# Patient Record
Sex: Female | Born: 1995 | Hispanic: Refuse to answer | Marital: Single | Smoking: Former smoker
Health system: Southern US, Community
[De-identification: ages and names within clinical notes are randomized; demographics above are authoritative.]

## PROBLEM LIST (undated history)

## (undated) ENCOUNTER — Inpatient Hospital Stay (HOSPITAL_COMMUNITY): Payer: Self-pay

## (undated) DIAGNOSIS — J45909 Unspecified asthma, uncomplicated: Secondary | ICD-10-CM

## (undated) DIAGNOSIS — Z87891 Personal history of nicotine dependence: Secondary | ICD-10-CM

## (undated) DIAGNOSIS — F1291 Cannabis use, unspecified, in remission: Secondary | ICD-10-CM

## (undated) DIAGNOSIS — Z87898 Personal history of other specified conditions: Secondary | ICD-10-CM

## (undated) HISTORY — PX: NO PAST SURGERIES: SHX2092

---
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
## 2016-01-07 ENCOUNTER — Encounter (HOSPITAL_COMMUNITY)
Admission: RE | Admit: 2016-01-07 | Discharge: 2016-01-07 | Disposition: A | Discharge status: Home / Self Care | Payer: Medicaid Other | Source: Ambulatory Visit | Attending: Obstetrics & Gynecology | Admitting: Obstetrics & Gynecology

## 2016-02-06 ENCOUNTER — Encounter (HOSPITAL_COMMUNITY)
Admission: RE | Admit: 2016-02-06 | Discharge: 2016-02-06 | Disposition: A | Discharge status: Home / Self Care | Payer: Self-pay | Source: Ambulatory Visit | Attending: Obstetrics & Gynecology | Admitting: Obstetrics & Gynecology

## 2016-03-07 ENCOUNTER — Encounter (HOSPITAL_COMMUNITY)
Admission: RE | Admit: 2016-03-07 | Discharge: 2016-03-07 | Disposition: A | Discharge status: Home / Self Care | Payer: Self-pay | Source: Ambulatory Visit | Attending: Obstetrics & Gynecology | Admitting: Obstetrics & Gynecology

## 2016-04-04 NOTE — Progress Notes (Signed)
Subjective:     Patient ID: Helen Strickland, female   DOB: 04/01/1996, 20 y.o.   MRN: [id number]  HPI Helen Strickland is a 20yo female presenting today for general exam to follow up vaginal discharge. - Denies any new changes in diagnoses, medications, family history, or social history. - Reports vaginal discharge. White with foul odor.  - Denies genital ulcers or vaginal pain. Denies fever. Denies abdominal pain or dysuria.  - Initially requested pap smear--thought this was for STD and vaginal discharge screening instead of cervical cancer screening. Not yet due for pap smear. - History of vaginal candidiasis, trichomonas, and bacterial vaginosis in 05/2015 - HIV and RPR negative in 05/2015 - Also requesting Nexplanon removal. States it has causes worsening headaches and irregular unpredictable periods. Placed in 02/2015 into left arm. - Does not know which form of birth control she would like to transition to. Has only had Nexplanon in the past. Would like more information on different methods. Reports history of migraines. Denies history or family history of clots. Nonsmoker.   Review of Systems Per HPI.     Objective:   Physical Exam  Constitutional: She appears well-developed and well-nourished. No distress.  HENT:  Head: Normocephalic and atraumatic.  Cardiovascular: Normal rate and regular rhythm.   No murmur heard. Pulmonary/Chest: Effort normal. No respiratory distress. She has no wheezes.  Abdominal: Soft. She exhibits no distension. There is no tenderness.  Genitourinary:  Genitourinary Comments: No vaginal discharge noted. No cervical motion tenderness noted. No adnexal tenderness noted. No vaginal wall tenderness noted.  Skin:  Nexplanon palpable in left upper arm  Psychiatric: She has a normal mood and affect. Her behavior is normal.      Assessment and Plan:     1. Screening for STD (sexually transmitted disease) - Will obtain Wet Prep and screen for Gonorrhea/Chlamydia -  Treatment pending above labs  2. Encounter for other general counseling or advice on contraception - Discussed various methods of birth control and their effectiveness. Handout given. - Return for procedure visit to remove nexplanon

## 2016-04-05 ENCOUNTER — Encounter: Payer: Self-pay | Admitting: Family Medicine

## 2016-04-05 ENCOUNTER — Other Ambulatory Visit (HOSPITAL_COMMUNITY)
Admission: RE | Admit: 2016-04-05 | Discharge: 2016-04-05 | Disposition: A | Discharge status: Home / Self Care | Payer: Medicaid Other | Source: Ambulatory Visit | Attending: Family Medicine | Admitting: Family Medicine

## 2016-04-05 ENCOUNTER — Ambulatory Visit (INDEPENDENT_AMBULATORY_CARE_PROVIDER_SITE_OTHER): Payer: Medicaid Other | Admitting: Family Medicine

## 2016-04-05 VITALS — BP 105/58 | HR 82 | Temp 98.2°F | Wt 123.4 lb

## 2016-04-05 DIAGNOSIS — Z0000 Encounter for general adult medical examination without abnormal findings: Secondary | ICD-10-CM

## 2016-04-05 DIAGNOSIS — Z113 Encounter for screening for infections with a predominantly sexual mode of transmission: Secondary | ICD-10-CM | POA: Diagnosis not present

## 2016-04-05 DIAGNOSIS — Z3009 Encounter for other general counseling and advice on contraception: Secondary | ICD-10-CM | POA: Diagnosis not present

## 2016-04-05 LAB — POCT WET PREP (WET MOUNT)
CLUE CELLS WET PREP WHIFF POC: NEGATIVE
Trichomonas Wet Prep HPF POC: ABSENT

## 2016-04-05 NOTE — Patient Instructions (Signed)
Thank you so much for coming to visit today! Please return to have your Nexplanon removed. Look over the handout on birth control methods and let [place] know if you have any questions. If you prefer an IUD, we will need to schedule you in our Gyn clinic for placement. We obtained several tests today and will let you know the results and if any treatment is needed.  Dr. Caroleen Hammanumley

## 2016-04-06 ENCOUNTER — Telehealth: Payer: Self-pay | Admitting: Family Medicine

## 2016-04-06 ENCOUNTER — Encounter (HOSPITAL_COMMUNITY)
Admission: RE | Admit: 2016-04-06 | Discharge: 2016-04-06 | Disposition: A | Discharge status: Home / Self Care | Payer: Self-pay | Source: Ambulatory Visit | Attending: Obstetrics & Gynecology | Admitting: Obstetrics & Gynecology

## 2016-04-06 DIAGNOSIS — Z309 Encounter for contraceptive management, unspecified: Secondary | ICD-10-CM | POA: Insufficient documentation

## 2016-04-06 MED ORDER — METRONIDAZOLE 500 MG PO TABS: 500.0000 mg | ORAL_TABLET | Freq: Two times a day (BID) | ORAL | 0 refills | Status: DC

## 2016-04-06 NOTE — Telephone Encounter (Signed)
Called Helen Strickland concerning wet prep results. Metronidazole sent to pharmacy for Bacterial Vaginosis.

## 2016-04-07 LAB — CERVICOVAGINAL ANCILLARY ONLY
Chlamydia: POSITIVE — AB
Neisseria Gonorrhea: NEGATIVE

## 2016-04-11 ENCOUNTER — Telehealth: Payer: Self-pay | Admitting: Family Medicine

## 2016-04-11 MED ORDER — DOXYCYCLINE HYCLATE 100 MG PO TABS: 100.0000 mg | ORAL_TABLET | Freq: Two times a day (BID) | ORAL | 0 refills | Status: DC

## 2016-04-11 NOTE — Telephone Encounter (Signed)
Attempted to contact concerning positive Chlamydia results. Prescription for Doxycycline sent to pharmacy. Will continue to attempt contact.

## 2016-04-12 MED ORDER — METRONIDAZOLE 500 MG PO TABS
500.0000 mg | ORAL_TABLET | Freq: Two times a day (BID) | ORAL | 0 refills | Status: DC
Start: 2016-04-12 — End: 2016-09-20

## 2016-04-12 NOTE — Telephone Encounter (Signed)
Attempted to contact to give results. Mother answered and stated [facility] was not available. Mother reported she had trouble picking up a prescription from the pharmacy. Pharmacy contacted and confirmed they did not receive the order. Metronidazole reordered and confirmed they had Doxycycline available. Will continue to attempt to contact Sanae to give her the results.  Dr. Caroleen Hammanumley

## 2016-04-17 ENCOUNTER — Ambulatory Visit: Payer: Medicaid Other | Admitting: Family Medicine

## 2016-04-17 NOTE — Telephone Encounter (Signed)
Called pt pharmacy to verify pt picked up meds.

## 2016-04-21 ENCOUNTER — Ambulatory Visit: Payer: Self-pay | Admitting: Family Medicine

## 2016-05-07 ENCOUNTER — Encounter (HOSPITAL_COMMUNITY)
Admission: RE | Admit: 2016-05-07 | Discharge: 2016-05-07 | Disposition: A | Discharge status: Home / Self Care | Payer: Self-pay | Source: Ambulatory Visit | Attending: Obstetrics & Gynecology | Admitting: Obstetrics & Gynecology

## 2016-06-06 ENCOUNTER — Ambulatory Visit (HOSPITAL_COMMUNITY)
Admission: RE | Admit: 2016-06-06 | Discharge: 2016-06-06 | Disposition: A | Discharge status: Home / Self Care | Payer: Self-pay | Source: Ambulatory Visit | Attending: Obstetrics & Gynecology | Admitting: Obstetrics & Gynecology

## 2016-07-06 ENCOUNTER — Ambulatory Visit (HOSPITAL_COMMUNITY)
Admission: RE | Admit: 2016-07-06 | Discharge: 2016-07-06 | Disposition: A | Discharge status: Home / Self Care | Payer: Self-pay | Source: Ambulatory Visit | Attending: Obstetrics & Gynecology | Admitting: Obstetrics & Gynecology

## 2016-08-07 ENCOUNTER — Ambulatory Visit (HOSPITAL_COMMUNITY)
Admission: RE | Admit: 2016-08-07 | Discharge: 2016-08-07 | Disposition: A | Discharge status: Home / Self Care | Payer: Self-pay | Source: Ambulatory Visit | Attending: Obstetrics & Gynecology | Admitting: Obstetrics & Gynecology

## 2016-09-19 ENCOUNTER — Ambulatory Visit (INDEPENDENT_AMBULATORY_CARE_PROVIDER_SITE_OTHER): Payer: Medicaid Other | Admitting: Internal Medicine

## 2016-09-19 ENCOUNTER — Other Ambulatory Visit (HOSPITAL_COMMUNITY)
Admission: RE | Admit: 2016-09-19 | Discharge: 2016-09-19 | Disposition: A | Discharge status: Home / Self Care | Payer: Medicaid Other | Source: Ambulatory Visit | Attending: Family Medicine | Admitting: Family Medicine

## 2016-09-19 ENCOUNTER — Encounter: Payer: Self-pay | Admitting: Internal Medicine

## 2016-09-19 VITALS — BP 116/52 | HR 94 | Temp 98.2°F | Ht 64.0 in | Wt 131.0 lb

## 2016-09-19 DIAGNOSIS — R1030 Lower abdominal pain, unspecified: Secondary | ICD-10-CM

## 2016-09-19 DIAGNOSIS — R109 Unspecified abdominal pain: Secondary | ICD-10-CM | POA: Diagnosis not present

## 2016-09-19 DIAGNOSIS — Z113 Encounter for screening for infections with a predominantly sexual mode of transmission: Secondary | ICD-10-CM | POA: Diagnosis present

## 2016-09-19 LAB — POCT WET PREP (WET MOUNT)
Clue Cells Wet Prep Whiff POC: NEGATIVE
TRICHOMONAS WET PREP HPF POC: ABSENT

## 2016-09-19 LAB — POCT UA - MICROSCOPIC ONLY

## 2016-09-19 LAB — POCT URINALYSIS DIPSTICK
Bilirubin, UA: NEGATIVE
Glucose, UA: NEGATIVE
KETONES UA: NEGATIVE
LEUKOCYTES UA: NEGATIVE
NITRITE UA: NEGATIVE
PH UA: 7
PROTEIN UA: NEGATIVE
Spec Grav, UA: 1.015
Urobilinogen, UA: 0.2

## 2016-09-19 LAB — POCT URINE PREGNANCY: PREG TEST UR: NEGATIVE

## 2016-09-19 NOTE — Progress Notes (Signed)
[facility] Progress Note  Subjective:  [name] is a 21 y.o. female who presents for SDA with concern for abdominal pain.  #Abdominal pain: - Ongoing since placement of nexplanon August 2016 but more severe than before with most recent menstrual cycle, which started 3/9.  - Pain has been so severe she had to stay in bed and had difficulty standing up. Today is the first day she has started to feel okay. Was not able to go to work since the 9th. - Has had cramping of lower abdomen and back prior to and during menstrual cycle since nexplanon placement. Has not tried much for this as does not like taking medicine. Took 600 mg ibuprofen once with no relief.  - Has had associated nausea, headaches, and some diarrhea - Has not had sex since the very beginning of March (prior to period) - First had irregular periods with nexplanon, then short cycles, now back to irregular bleeding (didn't have period since November until now) - Would like nexplanon removed - History of BV but no change in vaginal discharge recently ROS: No vomiting, no dyspareunia, no fever  Objective: Blood pressure (!) 116/52, pulse 94, temperature 98.2 F (36.8 C), temperature source Oral, height 5\' 4"  (1.626 m), weight 131 lb (59.4 kg), SpO2 99 %, unknown if currently breastfeeding. Constitutional: Well-appearing female, in NAD Cardiovascular: RRR, S1, S2, no m/r/g.  Pulmonary/Chest: Effort normal and breath sounds normal. No respiratory distress.  Abdominal: Soft. +BS, mild suprapubic tenderness to palpation, ND, no rebound or guarding. GU: No lesions noted on speculum exam. Blood at os. No TTP with bimanual exam. Chaperone present.  MSK: No CVA tenderness Skin: Skin is warm and dry. No rash noted.  Psychiatric: Normal mood and affect.  Vitals reviewed  Assessment/Plan: Abdominal cramping - Improving. Likely related to menstrual cycle but with irregular periods cannot rule-out pregnancy (though  unlikely with nexplanon) - U preg negative, UA negative for signs of infection - Wet prep negative for BV, trich, yeast - gc/chlamydia pending, though doubt PID given chronicity of symptoms, normal vitals and no cervical motion tenderness - HIV, RPR obtained - Recommended taking ibuprofen BID with onset of menstrual cycle - Patient to schedule visit for nexplanon removal.   Follow-up for nexplanon removal at patient's convenience.  Dani GobbleHillary Fitzgerald, MD [facility], PGY-2

## 2016-09-19 NOTE — Patient Instructions (Signed)
Helen Strickland,  I will call you with your lab results.   Please make an appointment for nexplanon removal at your convenience. I recommend taking ibuprofen 600 mg twice daily with the start of your periods.   Best, Dr. Sampson GoonFitzgerald

## 2016-09-20 DIAGNOSIS — R109 Unspecified abdominal pain: Secondary | ICD-10-CM | POA: Insufficient documentation

## 2016-09-20 LAB — GC/CHLAMYDIA PROBE AMP (~~LOC~~) NOT AT ARMC
Chlamydia: NEGATIVE
NEISSERIA GONORRHEA: NEGATIVE

## 2016-09-20 LAB — RPR

## 2016-09-20 LAB — HIV ANTIBODY (ROUTINE TESTING W REFLEX): HIV 1&2 Ab, 4th Generation: NONREACTIVE

## 2016-09-20 NOTE — Assessment & Plan Note (Addendum)
-   Improving. Likely related to menstrual cycle but with irregular periods cannot rule-out pregnancy (though unlikely with nexplanon) - U preg negative, UA negative for signs of infection - Wet prep negative for BV, trich, yeast - gc/chlamydia pending, though doubt PID given chronicity of symptoms, normal vitals and no cervical motion tenderness - HIV, RPR obtained - Recommended taking ibuprofen BID with onset of menstrual cycle - Patient to schedule visit for nexplanon removal.

## 2016-09-28 ENCOUNTER — Ambulatory Visit (INDEPENDENT_AMBULATORY_CARE_PROVIDER_SITE_OTHER): Payer: Medicaid Other | Admitting: Family Medicine

## 2016-09-28 VITALS — BP 114/62 | HR 80 | Temp 97.7°F | Wt 131.6 lb

## 2016-09-28 DIAGNOSIS — Z3046 Encounter for surveillance of implantable subdermal contraceptive: Secondary | ICD-10-CM

## 2016-09-28 MED ORDER — ACETAMINOPHEN 500 MG PO TABS
500.0000 mg | ORAL_TABLET | Freq: Once | ORAL | Status: AC
  Administered 2016-09-28: 500 mg via ORAL

## 2016-09-28 NOTE — Progress Notes (Signed)
Progestin Implant Removal Note Roland RackWilliams, Helen Strickland is here for removal of her etonogestrel rod implant (Implanon/Nexplanon). She would like it removed because of: S/E  An informed consent was taken prior to removal and is to be scanned into the Electronic Health Record.  Risks of the procedure include: bleeding, infection, difficulty with removal, scarring and nerve damage. There may be bruising at the site of incision and down the arm.  Procedure Note: Time out taken: 9:10am  Team:Drs. Diallo, Anastasio AuerbachGunadasa, Isiah Scheel, Tamika Martin  (PATIENT NAME) (PATIENT DOB) confirmed (YES)  Procedure: Progestin Implant Removal  Procedure confirmed by patient and team (YES)  Side: (LEFT)  Position correct for procedure (YES)  Equipment for procedure available (YES)  The patient is place in the supine position. Aseptic conditions are maintained. The rod is located by palpation. The area is cleaned with antiseptic. 5 cc of 1% lidocaine with epinephrine is injected just underneath the end of the implant closest to the elbow. After firmly pressing down on the end of the implant closer to the axilla a 2-3 mm incision is made with a scalpel. The rod is pushed to the incision site and grasped with a mosquito forceps and gently removed. Blunt dissection (WAS/) needed. The patient (DID/) tolerate the procedure well. The rod was removed in its entirety. The incision was dressed with a small adhesive bandage closure and a pressure dressing was applied. An alternate plan for contraception was discussed. The patient would like to use IUD for her contraception. She will think over this. In the mean time use condom as back up.   Janit PaganKehinde Sharada Albornoz, MD, MPH

## 2016-09-28 NOTE — Addendum Note (Signed)
Addended by: Clovis PuMARTIN, TAMIKA L on: 09/28/2016 03:42 PM   Modules accepted: Orders

## 2016-09-28 NOTE — Patient Instructions (Signed)
  Nexplanon Removal.   The effect of the implant will wear off a few hours after removal. Most women will be able to get pregnant within 3 weeks of removal.  A new implant can be inserted as soon as the old one is removed, if desired.  You may experience minor bruising, swelling, or discomfort at the removal site. This should only last for a couple of days.  You can use Tylenol and or Ibuprofen as needed for discomfort for the next few days. Please monitor for signs/symptoms of infection: spreading redness or worsening pain, fevers, drainage from the area. If you notice any of these symptoms please return to clinic as soon as possible to be evaluated.

## 2016-10-03 ENCOUNTER — Encounter: Payer: Self-pay | Admitting: Obstetrics and Gynecology

## 2016-10-03 ENCOUNTER — Ambulatory Visit (INDEPENDENT_AMBULATORY_CARE_PROVIDER_SITE_OTHER): Payer: Medicaid Other | Admitting: Obstetrics and Gynecology

## 2016-10-03 VITALS — BP 102/70 | HR 92 | Temp 98.7°F | Ht 64.0 in | Wt 129.0 lb

## 2016-10-03 DIAGNOSIS — G43009 Migraine without aura, not intractable, without status migrainosus: Secondary | ICD-10-CM

## 2016-10-03 MED ORDER — ASPIRIN-ACETAMINOPHEN-CAFFEINE 250-250-65 MG PO TABS: 1.0000 | ORAL_TABLET | Freq: Four times a day (QID) | ORAL | 1 refills | Status: DC | PRN

## 2016-10-03 NOTE — Progress Notes (Signed)
   Subjective:   Patient ID: Helen Strickland, female    DOB: 08/24/1995, 21 y.o.   MRN: [id number]  Patient presents for Same Day Appointment  Chief Complaint  Patient presents with  . Migraine    HPI: # Headache h/o migraines Has been having on and off again headache since Friday Not everyday but occurring more frequently Has not taken any medication; Will if gets bad Tried tylenol once which eased pain a little. 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   History  Smoking Status  . Former Smoker  Smokeless Tobacco  . Never Used    Past medical history, surgical, family, and social history reviewed and updated in the EMR as appropriate.  Objective:  BP 102/70   Pulse 92   Temp 98.7 F (37.1 C) (Oral)   Ht 5\' 4"  (1.626 m)   Wt 129 lb (58.5 kg)   LMP 09/15/2016 (Approximate)   SpO2 99%   BMI 22.14 kg/m  Vitals and nursing note reviewed  Physical Exam  Constitutional: She is oriented to person, place, and time and well-developed, well-nourished, and in no distress.  HENT:  Nose: Nose normal.  Mouth/Throat: Oropharynx is clear and moist.  Eyes: Conjunctivae and EOM are normal. Pupils are equal, round, and reactive to light.  Cardiovascular: Normal rate and regular rhythm.   Pulmonary/Chest: Effort normal and breath sounds normal.  Neurological: She is alert and oriented to person, place, and time. No cranial nerve deficit. Gait normal. Coordination normal. GCS score is 15.    Assessment & Plan:  1. Migraine  without aura and without status migrainosus, not intractable No red flags. Neuro exam unremarkable. Discussed step-up treatment with patient to include first Tylenol, if this doesn't help to try Excedrin migraine, if this then does not help to return to PCP to discuss triptan therapy. Believe that her symptoms can be managed with OTC medications at this time as they have worked before and patient hasn't fully tried them. Patient also encouraged to decrease stress as their may be a tension type component.   Diagnosis and plan along with any newly prescribed medication(s) were discussed in detail with this patient today. The patient verbalized understanding and agreed with the plan. Patient advised if symptoms worsen return to clinic or ER.   PATIENT EDUCATION PROVIDED: See AVS   Caryl AdaJazma Satoria Dunlop, DO 10/03/2016, 2:39 PM PGY-3, [facility]

## 2016-10-03 NOTE — Progress Notes (Deleted)
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 anything Blurry vision Starts on one side then goes all over Increased stress in life  HEADACHE  Headache started *** days ago Pain is *** Severity:  *** Location: *** Medications tried: *** Head trauma: no Sudden onset: *** Previous similar headaches: *** Taking blood thinners: *** History of cancer: ***  Symptoms Nose congestion stuffiness:  *** Nausea vomiting: no Photophobia: *** Noise sensitivity: *** Double vision or loss of vision: *** Fever: no Neck Stiffness: no Trouble walking or speaking: ***  Patient thinks cause of headache might be: ***   Review of Symptoms - see HPI PMH - Smoking status noted.

## 2016-10-03 NOTE — Patient Instructions (Signed)

## 2017-04-10 ENCOUNTER — Encounter: Payer: Self-pay | Admitting: Family Medicine

## 2018-02-05 ENCOUNTER — Ambulatory Visit: Payer: Medicaid Other | Admitting: Family Medicine

## 2018-04-29 ENCOUNTER — Ambulatory Visit: Payer: Medicaid Other | Admitting: Family Medicine

## 2018-06-08 ENCOUNTER — Other Ambulatory Visit: Payer: Self-pay

## 2018-06-08 ENCOUNTER — Encounter (HOSPITAL_COMMUNITY): Payer: Self-pay

## 2018-06-08 ENCOUNTER — Emergency Department (HOSPITAL_COMMUNITY)
Admission: EM | Admit: 2018-06-08 | Discharge: 2018-06-08 | Disposition: A | Discharge status: Home / Self Care | Payer: Medicaid Other | Attending: Emergency Medicine | Admitting: Emergency Medicine

## 2018-06-08 DIAGNOSIS — B373 Candidiasis of vulva and vagina: Secondary | ICD-10-CM | POA: Diagnosis not present

## 2018-06-08 DIAGNOSIS — Z87891 Personal history of nicotine dependence: Secondary | ICD-10-CM | POA: Insufficient documentation

## 2018-06-08 DIAGNOSIS — Z711 Person with feared health complaint in whom no diagnosis is made: Secondary | ICD-10-CM

## 2018-06-08 DIAGNOSIS — N939 Abnormal uterine and vaginal bleeding, unspecified: Secondary | ICD-10-CM | POA: Diagnosis not present

## 2018-06-08 DIAGNOSIS — F1290 Cannabis use, unspecified, uncomplicated: Secondary | ICD-10-CM | POA: Diagnosis not present

## 2018-06-08 DIAGNOSIS — Z202 Contact with and (suspected) exposure to infections with a predominantly sexual mode of transmission: Secondary | ICD-10-CM | POA: Insufficient documentation

## 2018-06-08 DIAGNOSIS — B3731 Acute candidiasis of vulva and vagina: Secondary | ICD-10-CM

## 2018-06-08 DIAGNOSIS — N938 Other specified abnormal uterine and vaginal bleeding: Secondary | ICD-10-CM | POA: Diagnosis present

## 2018-06-08 LAB — I-STAT CHEM 8, ED
BUN: 10 mg/dL (ref 6–20)
CHLORIDE: 103 mmol/L (ref 98–111)
Calcium, Ion: 1.16 mmol/L (ref 1.15–1.40)
Creatinine, Ser: 0.8 mg/dL (ref 0.44–1.00)
Glucose, Bld: 86 mg/dL (ref 70–99)
HEMATOCRIT: 42 % (ref 36.0–46.0)
HEMOGLOBIN: 14.3 g/dL (ref 12.0–15.0)
POTASSIUM: 3.8 mmol/L (ref 3.5–5.1)
Sodium: 138 mmol/L (ref 135–145)
TCO2: 26 mmol/L (ref 22–32)

## 2018-06-08 LAB — WET PREP, GENITAL
CLUE CELLS WET PREP: NONE SEEN
SPERM: NONE SEEN
TRICH WET PREP: NONE SEEN

## 2018-06-08 LAB — CBC WITH DIFFERENTIAL/PLATELET
ABS IMMATURE GRANULOCYTES: 0.01 10*3/uL (ref 0.00–0.07)
BASOS PCT: 1 %
Basophils Absolute: 0 10*3/uL (ref 0.0–0.1)
Eosinophils Absolute: 0 10*3/uL (ref 0.0–0.5)
Eosinophils Relative: 0 %
HCT: 40.5 % (ref 36.0–46.0)
HEMOGLOBIN: 13 g/dL (ref 12.0–15.0)
IMMATURE GRANULOCYTES: 0 %
LYMPHS ABS: 1.8 10*3/uL (ref 0.7–4.0)
LYMPHS PCT: 33 %
MCH: 29.5 pg (ref 26.0–34.0)
MCHC: 32.1 g/dL (ref 30.0–36.0)
MCV: 91.8 fL (ref 80.0–100.0)
MONOS PCT: 6 %
Monocytes Absolute: 0.3 10*3/uL (ref 0.1–1.0)
NEUTROS ABS: 3.3 10*3/uL (ref 1.7–7.7)
NEUTROS PCT: 60 %
Platelets: 243 10*3/uL (ref 150–400)
RBC: 4.41 MIL/uL (ref 3.87–5.11)
RDW: 14.3 % (ref 11.5–15.5)
WBC: 5.4 10*3/uL (ref 4.0–10.5)
nRBC: 0 % (ref 0.0–0.2)

## 2018-06-08 LAB — URINALYSIS, ROUTINE W REFLEX MICROSCOPIC
Bilirubin Urine: NEGATIVE
GLUCOSE, UA: NEGATIVE mg/dL
HGB URINE DIPSTICK: NEGATIVE
Ketones, ur: NEGATIVE mg/dL
LEUKOCYTES UA: NEGATIVE
Nitrite: NEGATIVE
PH: 6 (ref 5.0–8.0)
Protein, ur: >300 mg/dL — AB
Specific Gravity, Urine: >1.03 — ABNORMAL HIGH (ref 1.005–1.030)

## 2018-06-08 LAB — PREGNANCY, URINE: Preg Test, Ur: NEGATIVE

## 2018-06-08 LAB — URINALYSIS, MICROSCOPIC (REFLEX)

## 2018-06-08 MED ORDER — FLUCONAZOLE 150 MG PO TABS
150.0000 mg | ORAL_TABLET | Freq: Once | ORAL | Status: AC
  Administered 2018-06-08: 150 mg via ORAL
  Filled 2018-06-08: qty 1

## 2018-06-08 MED ORDER — CEFTRIAXONE SODIUM 250 MG IJ SOLR
250.0000 mg | Freq: Once | INTRAMUSCULAR | Status: AC
  Administered 2018-06-08: 250 mg via INTRAMUSCULAR
  Filled 2018-06-08: qty 250

## 2018-06-08 MED ORDER — AZITHROMYCIN 250 MG PO TABS
1000.0000 mg | ORAL_TABLET | Freq: Once | ORAL | Status: AC
  Administered 2018-06-08: 1000 mg via ORAL
  Filled 2018-06-08: qty 4

## 2018-06-08 NOTE — Discharge Instructions (Signed)
You have been treated today for an STD.   The test results with take 2-3 days to return. If there is an abnormal result, you will be notified. If you do not hear anything, that means the results were negative. You can also log on MyChart to see the results.   Your sexual partner needs to be treated too. Do not have sexual intercourse for the next 7 days and after your partner has been treated.   Follow-up with your primary care doctor in 2-4 days. If you do not have a primary care doctor, you can use one listed in the paperwork.   Return to the Emergency Department for any fever, abdominal pain, difficulty breathing, nausea/vomiting or any other worsening or concerning symptoms.   As discussed, follow-up with [facility] hospital for evaluation of your dysfunctional uterine bleeding.  Return to emergency department for any worsening pain, vaginal bleeding, fevers, vomiting.

## 2018-06-08 NOTE — ED Provider Notes (Signed)
[facility] Provider Note   CSN: [id number] Arrival date & time: 06/08/18  1052     History   Chief Complaint Chief Complaint  Patient presents with  . Menstrual Problem    HPI Helen Strickland is a 22 y.o. female with PMH/o Asthma, marijuana use who comes for evaluation of irregular menstrual cycles.  Patient reports she has a history of irregular cycles and states she normally reported a month.  Also reports that she also has heavy cramping and bleeding during her menstrual cycles.  She states that this is normal for her.  She comes in today because she has had 3 menstrual cycles this month.  Her most recent one started about 4 days ago.  She states that she was concerned because she noticed some brown discharge/bleeding during the menstrual cycle.  She noticed some small clots.  She states that the vaginal bleeding has stopped since.  She also reports she had some lower abdominal cramping and cramping that radiated to her posterior back.  Patient came in today because she really wanted to get evaluated since this was her third menstrual cycle and that the discharge was brown.  She states that her abdominal cramping and vaginal bleeding have resolved.  She still little bit of cramping in the posterior back.  Patient reports she is currently not on any birth control.  She does state that she is currently sexually active with 2 partners.  She does use condoms. She has had some vaginal discharge but she states it is not worse than her normal.  She has had a history of trichomonas before.  Patient denies any fevers, nausea/vomiting or dysuria, hematuria, abdominal pain.  The history is provided by the patient.    Past Medical History:  Diagnosis Date  . Asthma    exercise induced asthma; "been years since used inhaler"  . Former smoker   . History of marijuana use    not while pregnant-2016     Patient Active Problem List   Diagnosis Date Noted    . Abdominal cramping 09/20/2016  . Contraception management 04/06/2016    Past Surgical History:  Procedure Laterality Date  . NO PAST SURGERIES       OB History    Gravida  1   Para  1   Term      Preterm  1   AB      Living  1     SAB      TAB      Ectopic      Multiple  0   Live Births  1            Home Medications    Prior to Admission medications   Medication Sig Start Date End Date Taking? Authorizing Provider  aspirin-acetaminophen-caffeine (EXCEDRIN MIGRAINE) [phone] [id number] MG tablet Take 1 tablet by mouth every 6 (six) hours as needed for headache or migraine. 10/03/16   Pincus LargePhelps, Jazma Y, DO  etonogestrel (NEXPLANON) 68 MG IMPL implant 1 each by Subdermal route once.    [provider]    Family History Family History  Problem Relation Age of Onset  . Alcohol abuse Neg Hx     Social History Social History   Tobacco Use  . Smoking status: Former Games developermoker  . Smokeless tobacco: Never Used  Substance Use Topics  . Alcohol use: No    Comment: not while pregnant   . Drug use: Yes    Types: Marijuana  Allergies   Patient has no known allergies.   Review of Systems Review of Systems  Constitutional: Negative for fever.  Cardiovascular: Negative for chest pain.  Gastrointestinal: Negative for abdominal pain, nausea and vomiting.  Genitourinary: Positive for vaginal bleeding (resolved) and vaginal discharge. Negative for dysuria and hematuria.  All other systems reviewed and are negative.    Physical Exam Updated Vital Signs BP (!) 126/58 (BP Location: Right Arm)   Pulse 70   Temp 98.4 F (36.9 C) (Oral)   Resp 12   SpO2 100%   Physical Exam  Constitutional: She appears well-developed and well-nourished.  HENT:  Head: Normocephalic and atraumatic.  Eyes: Conjunctivae and EOM are normal. Right eye exhibits no discharge. Left eye exhibits no discharge. No scleral icterus.  Pulmonary/Chest: Effort normal.  Abdominal:  Normal appearance and bowel sounds are normal. There is no tenderness. There is CVA tenderness (bilateral). There is no tenderness at McBurney's point.  Abdomen is soft, non-distended, non-tender. No rigidity, No guarding. No peritoneal signs. Bilateral CVA tenderness.   Genitourinary: Cervix exhibits discharge. Right adnexum displays no mass and no tenderness. Left adnexum displays no mass and no tenderness. No bleeding in the vagina. Vaginal discharge found.  Genitourinary Comments: The exam was performed with a chaperone present. Normal external female genitalia. No lesions, rash, or sores. Thick, white discharged noted in the vaginal vault. No evidence of bleeding. Cervical Os is closed. No cervical friability. No CMT.   Neurological: She is alert.  Skin: Skin is warm and dry.  Psychiatric: She has a normal mood and affect. Her speech is normal and behavior is normal.  Nursing note and vitals reviewed.    ED Treatments / Results  Labs (all labs ordered are listed, but only abnormal results are displayed) Labs Reviewed  WET PREP, GENITAL - Abnormal; Notable for the following components:      Result Value   Yeast Wet Prep HPF POC PRESENT (*)    WBC, Wet Prep HPF POC MANY (*)    All other components within normal limits  URINALYSIS, ROUTINE W REFLEX MICROSCOPIC - Abnormal; Notable for the following components:   APPearance HAZY (*)    Specific Gravity, Urine >1.030 (*)    Protein, ur >300 (*)    All other components within normal limits  URINALYSIS, MICROSCOPIC (REFLEX) - Abnormal; Notable for the following components:   Bacteria, UA FEW (*)    All other components within normal limits  PREGNANCY, URINE  CBC WITH DIFFERENTIAL/PLATELET  I-STAT CHEM 8, ED  GC/CHLAMYDIA PROBE AMP ([place]) NOT AT [facility]    EKG None  Radiology No results found.  Procedures Procedures (including critical care time)  Medications Ordered in ED Medications  cefTRIAXone (ROCEPHIN) injection 250  mg (250 mg Intramuscular Given 06/08/18 1357)  azithromycin (ZITHROMAX) tablet 1,000 mg (1,000 mg Oral Given 06/08/18 1356)  fluconazole (DIFLUCAN) tablet 150 mg (150 mg Oral Given 06/08/18 1357)     Initial Impression / Assessment and Plan / ED Course  I have reviewed the triage vital signs and the nursing notes.  Pertinent labs & imaging results that were available during my care of the patient were reviewed by me and considered in my medical decision making (see chart for details).     22 year old female who presents for evaluation of irregular menstrual cycle.  Reports she started her menstrual cycle 4 days.  This is the third 1 month.  She does report that she normally has 2 menstrual cycles per month.  She states that her period is usually irregular and has heavy bleeding heavy cramps.  She also reports that she has some brown products that she was concerned.  She is not currently on birth control.  She is currently sexually active with more than one partner.  She reports that she is having some vaginal discharge but states it is not worse than normal.  No current abdominal pain, nausea/vomiting, fever. Patient is afebrile, non-toxic appearing, sitting comfortably on examination table. Vital signs reviewed and stable.  Abdomen exam benign.  She does have some bilateral CVA tenderness.  Question history of CVA or muscular tenderness.  Plan to check urine, urine pregnancy.  We will plan for pelvic.  Consider UTI versus dysfunctional uterine bleeding.  History/physical exam is not concerning for ovarian torsion, appendicitis.  Pelvic exam as documented above.  Patient had thick white discharge in the vaginal wall.  No evidence of bleeding.  Cervical loss appears closed.  No cervical friability.  She did have some mild cervical discharge.  No CMT that would be concerning for PID.  No adnexal mass or tenderness bilaterally.  No indication for ultrasound evaluation at this time.  Urine shows no  leukocytosis.  She does have proteinuria.  I looked through her records and she has had this previously before.  Her last BUN and creatinine 07/2011.  We will add additional basic lab work to evaluate hemoglobin given recent bleeding and BUN and creatinine.  Urine pregnancy negative.  Wet prep positive for yeast cells.  Urine creatinine within normal limits.  Hemoglobin normal.  Discussed results with patient.  Discussed treatment options regarding cervical discharge possible STDs.  Patient states she would like to be treated today.  No known drug allergies.  Encourage patient on safe sexual practices.  We will give her a referral to [facility] for follow-up of her abnormal uterine bleeding. At this time, patient exhibits no emergent life-threatening condition that require further evaluation in ED. Patient had ample opportunity for questions and discussion. All patient's questions were answered with full understanding. Strict return precautions discussed. Patient expresses understanding and agreement to plan.   Final Clinical Impressions(s) / ED Diagnoses   Final diagnoses:  Vaginal candidiasis  Dysfunctional uterine bleeding  Concern about STD in female without diagnosis    ED Discharge Orders    None       Rosana Hoes 06/08/18 1403    Mesner, Barbara Cower, MD 06/08/18 2021

## 2018-06-08 NOTE — ED Triage Notes (Addendum)
Pt states she has had 3 menstrual cycles this month. Pt states hx of irregular cycles but never this bad. Pt denies heavy bleeding. Pt states she also has some lower abdominal pain.

## 2018-06-10 LAB — GC/CHLAMYDIA PROBE AMP (~~LOC~~) NOT AT ARMC
CHLAMYDIA, DNA PROBE: POSITIVE — AB
Neisseria Gonorrhea: NEGATIVE

## 2018-07-22 ENCOUNTER — Ambulatory Visit (INDEPENDENT_AMBULATORY_CARE_PROVIDER_SITE_OTHER): Payer: Medicaid Other | Admitting: Family Medicine

## 2018-07-22 ENCOUNTER — Other Ambulatory Visit: Payer: Self-pay

## 2018-07-22 ENCOUNTER — Other Ambulatory Visit (HOSPITAL_COMMUNITY)
Admission: RE | Admit: 2018-07-22 | Discharge: 2018-07-22 | Disposition: A | Discharge status: Home / Self Care | Payer: Medicaid Other | Source: Ambulatory Visit | Attending: Family Medicine | Admitting: Family Medicine

## 2018-07-22 VITALS — BP 100/66 | HR 96 | Temp 98.6°F | Ht 65.0 in | Wt 129.4 lb

## 2018-07-22 DIAGNOSIS — A599 Trichomoniasis, unspecified: Secondary | ICD-10-CM | POA: Diagnosis not present

## 2018-07-22 DIAGNOSIS — Z124 Encounter for screening for malignant neoplasm of cervix: Secondary | ICD-10-CM | POA: Diagnosis not present

## 2018-07-22 DIAGNOSIS — Z202 Contact with and (suspected) exposure to infections with a predominantly sexual mode of transmission: Secondary | ICD-10-CM | POA: Diagnosis not present

## 2018-07-22 DIAGNOSIS — Z113 Encounter for screening for infections with a predominantly sexual mode of transmission: Secondary | ICD-10-CM

## 2018-07-22 DIAGNOSIS — R109 Unspecified abdominal pain: Secondary | ICD-10-CM

## 2018-07-22 LAB — POCT WET PREP (WET MOUNT): Clue Cells Wet Prep Whiff POC: POSITIVE

## 2018-07-22 LAB — POCT URINE PREGNANCY: Preg Test, Ur: NEGATIVE

## 2018-07-22 MED ORDER — CEFTRIAXONE SODIUM 250 MG IJ SOLR
250.0000 mg | Freq: Once | INTRAMUSCULAR | Status: AC
  Administered 2018-07-22: 250 mg via INTRAMUSCULAR

## 2018-07-22 MED ORDER — METRONIDAZOLE 500 MG PO TABS: 2000.0000 mg | ORAL_TABLET | Freq: Once | ORAL | 0 refills | Status: AC

## 2018-07-22 MED ORDER — FLUCONAZOLE 150 MG PO TABS: 150.0000 mg | ORAL_TABLET | Freq: Once | ORAL | 0 refills | Status: AC

## 2018-07-22 MED ORDER — AZITHROMYCIN 250 MG PO TABS
1000.0000 mg | ORAL_TABLET | Freq: Once | ORAL | Status: AC
  Administered 2018-07-22: 1000 mg via ORAL

## 2018-07-22 MED ORDER — METRONIDAZOLE 250 MG PO TABS
2000.0000 mg | ORAL_TABLET | Freq: Once | ORAL | Status: AC
  Administered 2018-07-22: 2000 mg via ORAL

## 2018-07-22 NOTE — Progress Notes (Signed)
  Subjective:    Patient ID: Helen Strickland, female    DOB: 03/29/1996, 23 y.o.   MRN: [id number]   CC: STI testing  HPI:  STI testing: Patient here for STI testing after possible exposure Patient due for her Pap smear today and will perform. Patient positive for chlamydia 05/29/2018 and was treated adequately in ED. Patient reports partner was not treated and she has known exposure with unprotected sex.  Reports yellowish creamy discharge with no odor. Patient also concerned that she may be pregnant given that her last menstrual period on 12/11 was lighter than normal. ROS: Denies any abnormal vaginal bleeding,  abdominal pain, fevers, chills, vomiting or diarrhea.  Smoking status reviewed  ROS: 10 point ROS is otherwise negative, except as mentioned in HPI  Patient Active Problem List   Diagnosis Date Noted  . Abdominal cramping 09/20/2016  . Contraception management 04/06/2016     Objective:  BP 100/66   Pulse 96   Temp 98.6 F (37 C) (Oral)   Ht 5\' 5"  (1.651 m)   Wt 129 lb 6.4 oz (58.7 kg)   LMP 06/19/2018 (Approximate)   SpO2 100%   BMI 21.53 kg/m  Vitals and nursing note reviewed  General: NAD, pleasant Respiratory: normal effort GU/GYN: External genitalia within normal limits.  Vaginal mucosa pink, moist, normal rugae.  Nonfriable cervix without lesions or bleeding noted on speculum exam.  Large amount of yellow-green thick discharge noted. Bimanual exam revealed normal, nongravid uterus.  No cervical motion tenderness. No adnexal masses bilaterally. Exam performed in the presence of a chaperone. Extremities: no edema or cyanosis. WWP. Skin: warm and dry, no rashes noted Neuro: alert and oriented, no focal deficits Psych: normal affect, normal thought content, normal judgement  Assessment & Plan:    Abdominal cramping Patient with known exposure to chlamydia given that she has been sexually active with her partner who was never treated.  Patient treated for  chlamydia on 07/09/2019.   -Pap smear performed today.  -will also test for gonorrhea and chlamydia, however given known exposure will treat with CTX 250 IM and azithromycin 1000 mg x 1.  -Patient counseled on avoiding sexual intercourse for at least 7 days after treatment.   -Counseled on condom usage.   -Counseled on the importance of partner getting treated or avoidance of intercourse without person until partner is treated. -Cervical motion tenderness on exam.  Wet prep shows signs of trichomonas as well as clue cells suggestive of BV.   -Will treat patient with 2 g of metronidazole here in office.  -Prescription sent to pharmacy for patient to have partner take for trichomonas.   -U. Preg negative -Patient to follow-up in 4 to 6 weeks with regular doctor.    [place] Alis Sawchuk, DO Family Medicine Resident PGY-2

## 2018-07-22 NOTE — Assessment & Plan Note (Signed)
Patient with known exposure to chlamydia given that she has been sexually active with her partner who was never treated.  Patient treated for chlamydia on 07/09/2019.   -Pap smear performed today.  -will also test for gonorrhea and chlamydia, however given known exposure will treat with CTX 250 IM and azithromycin 1000 mg x 1.  -Patient counseled on avoiding sexual intercourse for at least 7 days after treatment.   -Counseled on condom usage.   -Counseled on the importance of partner getting treated or avoidance of intercourse without person until partner is treated. -Cervical motion tenderness on exam.  Wet prep shows signs of trichomonas as well as clue cells suggestive of BV.   -Will treat patient with 2 g of metronidazole here in office.  -Prescription sent to pharmacy for patient to have partner take for trichomonas.   -U. Preg negative -Patient to follow-up in 4 to 6 weeks with regular doctor.

## 2018-07-22 NOTE — Patient Instructions (Addendum)
Thank you for coming to see me today. It was a pleasure! Today we talked about:   We will follow up with you regarding your test results. Please avoid sexual intercourse for 7 days after treatment. Your partner will also need to be treated, I have sent your partner's treatment into the pharmacy for trichomonas. Your partner will still need to be treated for chlamydia.  If you continue to have symptoms, please come back to the office.   Please follow-up with your regular doctor 4-6 weeks.  If you have any questions or concerns, please do not hesitate to call the office at [phone].  Take Care,   Martinique Omer Monter, DO  Preventive Care 18-39 Years, Female Preventive care refers to lifestyle choices and visits with your health care provider that can promote health and wellness. What does preventive care include?   A yearly physical exam. This is also called an annual well check.  Dental exams once or twice a year.  Routine eye exams. Ask your health care provider how often you should have your eyes checked.  Personal lifestyle choices, including: ? Daily care of your teeth and gums. ? Regular physical activity. ? Eating a healthy diet. ? Avoiding tobacco and drug use. ? Limiting alcohol use. ? Practicing safe sex. ? Taking vitamin and mineral supplements as recommended by your health care provider. What happens during an annual well check? The services and screenings done by your health care provider during your annual well check will depend on your age, overall health, lifestyle risk factors, and family history of disease. Counseling Your health care provider may ask you questions about your:  Alcohol use.  Tobacco use.  Drug use.  Emotional well-being.  Home and relationship well-being.  Sexual activity.  Eating habits.  Work and work Statistician.  Method of birth control.  Menstrual cycle.  Pregnancy history. Screening You may have the following tests or  measurements:  Height, weight, and BMI.  Diabetes screening. This is done by checking your blood sugar (glucose) after you have not eaten for a while (fasting).  Blood pressure.  Lipid and cholesterol levels. These may be checked every 5 years starting at age 57.  Skin check.  Hepatitis C blood test.  Hepatitis B blood test.  Sexually transmitted disease (STD) testing.  BRCA-related cancer screening. This may be done if you have a family history of breast, ovarian, tubal, or peritoneal cancers.  Pelvic exam and Pap test. This may be done every 3 years starting at age 48. Starting at age 42, this may be done every 5 years if you have a Pap test in combination with an HPV test. Discuss your test results, treatment options, and if necessary, the need for more tests with your health care provider. Vaccines Your health care provider may recommend certain vaccines, such as:  Influenza vaccine. This is recommended every year.  Tetanus, diphtheria, and acellular pertussis (Tdap, Td) vaccine. You may need a Td booster every 10 years.  Varicella vaccine. You may need this if you have not been vaccinated.  HPV vaccine. If you are 55 or younger, you may need three doses over 6 months.  Measles, mumps, and rubella (MMR) vaccine. You may need at least one dose of MMR. You may also need a second dose.  Pneumococcal 13-valent conjugate (PCV13) vaccine. You may need this if you have certain conditions and were not previously vaccinated.  Pneumococcal polysaccharide (PPSV23) vaccine. You may need one or two doses if you smoke  cigarettes or if you have certain conditions.  Meningococcal vaccine. One dose is recommended if you are age 22-21 years and a first-year college student living in a residence hall, or if you have one of several medical conditions. You may also need additional booster doses.  Hepatitis A vaccine. You may need this if you have certain conditions or if you travel or work in  places where you may be exposed to hepatitis A.  Hepatitis B vaccine. You may need this if you have certain conditions or if you travel or work in places where you may be exposed to hepatitis B.  Haemophilus influenzae type b (Hib) vaccine. You may need this if you have certain risk factors. Talk to your health care provider about which screenings and vaccines you need and how often you need them. This information is not intended to replace advice given to you by your health care provider. Make sure you discuss any questions you have with your health care provider. Document Released: 08/22/2001 Document Revised: 02/06/2017 Document Reviewed: 04/27/2015 Elsevier Interactive Patient Education  2019 [employer].

## 2018-07-23 LAB — RPR: RPR Ser Ql: NONREACTIVE

## 2018-07-23 LAB — HIV ANTIBODY (ROUTINE TESTING W REFLEX): HIV Screen 4th Generation wRfx: NONREACTIVE

## 2018-07-24 LAB — CERVICOVAGINAL ANCILLARY ONLY
Chlamydia: NEGATIVE
Neisseria Gonorrhea: NEGATIVE

## 2018-07-24 LAB — CYTOLOGY - PAP
Diagnosis: NEGATIVE
HPV: NOT DETECTED

## 2018-07-26 ENCOUNTER — Telehealth: Payer: Self-pay | Admitting: *Deleted

## 2018-07-26 NOTE — Telephone Encounter (Signed)
LM with mother asking her to have patient call us back.  Jazmin Hartsell,CMA

## 2018-07-26 NOTE — Telephone Encounter (Signed)
Patient aware. Mickey Hebel, RN ([facility] RN)  

## 2018-07-26 NOTE — Telephone Encounter (Signed)
-----   Message from Henri Medal, [place] sent at 07/26/2018 11:43 AM EST ----- Please call and inform patient that her pap is normal, and she will need repeat screen in 3 years. Her HIV and rpr are negative, as well as GC/Chlamydia. Patient was treated for trichomonas in office.  Thank you.

## 2018-07-31 ENCOUNTER — Ambulatory Visit (INDEPENDENT_AMBULATORY_CARE_PROVIDER_SITE_OTHER): Payer: Medicaid Other | Admitting: Family Medicine

## 2018-07-31 ENCOUNTER — Encounter: Payer: Self-pay | Admitting: Family Medicine

## 2018-07-31 ENCOUNTER — Other Ambulatory Visit: Payer: Self-pay

## 2018-07-31 VITALS — BP 104/62 | HR 98 | Temp 98.6°F | Wt 130.2 lb

## 2018-07-31 DIAGNOSIS — F4310 Post-traumatic stress disorder, unspecified: Secondary | ICD-10-CM | POA: Diagnosis not present

## 2018-07-31 NOTE — Patient Instructions (Signed)
It was a pleasure to see you today! Thank you for choosing [facility] for your primary care. Helen Strickland was seen for anxiety, mood.   Our plans for today were:  I placed a referral to the psychiatrist. They should call you. Call me if they don't call you in one week.   Make an appt with Gavin Poundeborah and she may help you find a therapist as well.   Best,  Dr. Chanetta Marshallimberlake
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
## 2018-08-06 ENCOUNTER — Ambulatory Visit (INDEPENDENT_AMBULATORY_CARE_PROVIDER_SITE_OTHER): Payer: Medicaid Other | Admitting: Licensed Clinical Social Worker

## 2018-08-06 DIAGNOSIS — Z789 Other specified health status: Secondary | ICD-10-CM

## 2018-08-06 DIAGNOSIS — Z8659 Personal history of other mental and behavioral disorders: Secondary | ICD-10-CM

## 2018-08-07 NOTE — BH Specialist Note (Signed)
Integrated Behavioral Health Initial Visit  MRN: [id number] Name: Helen Strickland  Session Start time: 3:15  Session End time: 4:05 Total time: 50 minutes Type of Service: Integrated Behavioral Health SUBJECTIVE: Helen Strickland is a 23 y.o. female referred by Dr. Chanetta Marshall for assistance with getting connected with behavioral health resources Per PCP patient was positive on MDQ and needs further evaluation.  She has placed a referral to psychiatry.  Report of symptoms: feeling anxious, difficulty with focus, GAD=17 and PHQ=15 from previous with PCP. ASSESSMENT: Mood: Euthymic and Affect: Appropriate and Tearful at times.  Patient is pleasant reserved but engaged in conversation.  She is currently experiencing symptoms of anxiety and depression which are exacerbated by life transitions, financial stressors and effects of adverse childhood experiences. She has managed by utilizing self reflection, having alone time, relaxed breathing and cannabis .  She is now open to seeking professional help with managing her behavior.  Patient may benefit from and is in agreement to receive further assessment and therapeutic interventions to assist with managing her symptoms.  GOALS: Patient will: 1. Reduce symptoms of: anxiety, depression and stress 2. Increase knowledge and/or ability of: coping skills and self-management skills  PLAN: 1. Patient will contact 2 of the 3 therapist discussed on her list 2. LCSW will contact patient in 1 week to see if she connected with therapist (mom's number [phone]) 3. Patient will provide an update if she has been contacted by psycyhiatry  4. Patient will consider contacting resources provided for assistance with employment and  5. Wait list for day care voucher  _______________________________________________________  See additional psych history in note from PCP 07/31/2018 LIFE CONTEXT: Family and Social: lives with 21 year old son and several extended  family members, has financial difficulties,  School/Work: looking for employment; barriers with childcare, and reliable transportation uses mothers car when available Life Changes: recently lost job,   INTERVENTION:  Copywriter, advertising and Supportive Counseling,  Publishing rights manager, Psychoeducation and Referral to Counselor/Psychotherapist    Sammuel Hines, LCSW [facility]   [phone] 8:25 AM

## 2018-08-16 ENCOUNTER — Telehealth: Payer: Self-pay | Admitting: Licensed Clinical Social Worker

## 2018-08-16 NOTE — Telephone Encounter (Signed)
  F/U call to patient reference getting connected to therapy resources provided during office visit   Patient has contacted [facility] and waiting for return call to schedule an appointment. She has not been contacted by psychiatry referral placed by PCP.  Patient appreciative of F/U call.  Plan: LCSW will F/ U with referral coordinator for an update and status of the referral.  Sammuel Hines, LCSW [facility]   [phone] 3:26 PM

## 2018-08-27 ENCOUNTER — Telehealth: Payer: Self-pay | Admitting: Licensed Clinical Social Worker

## 2018-08-27 NOTE — Telephone Encounter (Signed)
   Outreach Note  08/27/2018 Name: Melvia Nooner MRN: [id number] DOB: [birth year]  Referred by: Garth Bigness, MD Reason for referral : Care Coordination ([facility] F/U call)  An unsuccessful telephone outreach was attempted today to see if patient connected with ongoing counseling and psychiatry services.  Left message to call LCSW.   Plan: If no return call is received. LCSW will call again in 1 to 2 days.  Sammuel Hines, LCSW [facility]   [phone] 12:29 PM

## 2018-08-30 NOTE — Telephone Encounter (Signed)
2nd unsuccessful telephone outreach attempt to Ms. Helen Strickland today. Left message with patient's mother to call LCSW.    Plan: LCSW will wait for return call, if no return call is received, will reach out to Ms. Helen Strickland again over the next 7 days. If unable to reach Ms. Helen Strickland by phone on the 3rd attempt, LCSW will discontinue outreach calls but will be available at any time to provide services and assistance to Ms. Helen Strickland.   Sammuel Hines, LCSW [facility]   [phone] 10:06 AM

## 2018-09-06 NOTE — Telephone Encounter (Signed)
3rd unsuccessful telephone outreach attempt to Helen Strickland today. Left message to call LCSW.  Plan: LCSW will discontinue outreach calls but will gladly be available at any time to provide services to Helen Strickland.  Helen Bigness, MD has been notified of this outreach plan.   Helen Hines, LCSW [facility]   [phone] 2:00 PM

## 2018-11-07 ENCOUNTER — Ambulatory Visit (INDEPENDENT_AMBULATORY_CARE_PROVIDER_SITE_OTHER): Payer: Medicaid Other | Admitting: Child and Adolescent Psychiatry

## 2018-11-07 ENCOUNTER — Other Ambulatory Visit: Payer: Self-pay

## 2018-11-07 ENCOUNTER — Encounter: Payer: Self-pay | Admitting: Child and Adolescent Psychiatry

## 2018-11-07 DIAGNOSIS — F331 Major depressive disorder, recurrent, moderate: Secondary | ICD-10-CM

## 2018-11-07 DIAGNOSIS — F4312 Post-traumatic stress disorder, chronic: Secondary | ICD-10-CM | POA: Diagnosis not present

## 2018-11-07 DIAGNOSIS — F418 Other specified anxiety disorders: Secondary | ICD-10-CM | POA: Diagnosis not present

## 2018-11-07 MED ORDER — ESCITALOPRAM OXALATE 10 MG PO TABS: ORAL_TABLET | ORAL | 0 refills | Status: DC
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
## 2018-11-07 NOTE — Progress Notes (Signed)
Tc on  11-07-18 @ 10:41 pt medical and surgical hx was reviewed and updated. Pt medications and pharmacy were reviewed and updated. Pt allergies were reviewed with no updates. No vitals taken due to this was a phone visit.
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
## 2018-11-21 ENCOUNTER — Other Ambulatory Visit: Payer: Self-pay

## 2018-11-21 ENCOUNTER — Ambulatory Visit: Payer: Medicaid Other | Admitting: Child and Adolescent Psychiatry

## 2018-12-10 ENCOUNTER — Other Ambulatory Visit: Payer: Self-pay

## 2018-12-10 ENCOUNTER — Ambulatory Visit: Payer: Medicaid Other | Admitting: Child and Adolescent Psychiatry

## 2018-12-10 ENCOUNTER — Telehealth: Payer: Self-pay | Admitting: Child and Adolescent Psychiatry

## 2018-12-10 NOTE — Telephone Encounter (Signed)
Spoke with pt on her scheduled appointment time 10:00 pm. 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 Offered 3 pm today and requested front desk to change the appointment time.

## 2018-12-10 NOTE — Telephone Encounter (Signed)
Pt had an appointment which was scheduled in the morning at 10, which she requested to change to 3 pm, due to conflict of meeting in this provider's calender pt was asked if she can follow up at 3:30 instead. Pt agreed. Writer discussed that Clinical research associate will send the link to her cell phone to connect for telemedicine encounter. Writer sent link to connect at her appointment at 3:30 but pt did not connect. Called and left VM for pt to call back. No response.

## 2019-01-31 ENCOUNTER — Other Ambulatory Visit: Payer: Self-pay | Admitting: Family Medicine

## 2019-01-31 ENCOUNTER — Other Ambulatory Visit: Payer: Self-pay

## 2019-01-31 ENCOUNTER — Ambulatory Visit (HOSPITAL_COMMUNITY)
Admission: RE | Admit: 2019-01-31 | Discharge: 2019-01-31 | Disposition: A | Discharge status: Home / Self Care | Payer: Medicaid Other | Source: Ambulatory Visit | Attending: Family Medicine | Admitting: Family Medicine

## 2019-01-31 ENCOUNTER — Telehealth: Payer: Self-pay | Admitting: Family Medicine

## 2019-01-31 ENCOUNTER — Ambulatory Visit: Payer: Medicaid Other | Admitting: Family Medicine

## 2019-01-31 ENCOUNTER — Encounter: Payer: Self-pay | Admitting: Family Medicine

## 2019-01-31 VITALS — BP 102/70 | HR 95

## 2019-01-31 DIAGNOSIS — G8929 Other chronic pain: Secondary | ICD-10-CM

## 2019-01-31 DIAGNOSIS — M545 Low back pain, unspecified: Secondary | ICD-10-CM

## 2019-01-31 DIAGNOSIS — M5440 Lumbago with sciatica, unspecified side: Secondary | ICD-10-CM | POA: Diagnosis not present

## 2019-01-31 DIAGNOSIS — M4134 Thoracogenic scoliosis, thoracic region: Secondary | ICD-10-CM

## 2019-01-31 DIAGNOSIS — M546 Pain in thoracic spine: Secondary | ICD-10-CM | POA: Diagnosis not present

## 2019-01-31 MED ORDER — GABAPENTIN 100 MG PO CAPS: 100.0000 mg | ORAL_CAPSULE | Freq: Three times a day (TID) | ORAL | 0 refills | Status: DC

## 2019-01-31 MED ORDER — IBUPROFEN 400 MG PO TABS: 400.0000 mg | ORAL_TABLET | Freq: Three times a day (TID) | ORAL | 0 refills | Status: DC | PRN

## 2019-01-31 MED ORDER — KETOROLAC TROMETHAMINE 30 MG/ML IJ SOLN
30.0000 mg | Freq: Once | INTRAMUSCULAR | Status: AC
  Administered 2019-01-31: 30 mg via INTRAMUSCULAR

## 2019-01-31 NOTE — Patient Instructions (Signed)
Take Gabapentin daily to help with your back pain and the tingling. You can use Ibuprofen for breath through pain.  Please go to radiology for xray.  If you experience worsening of your pain or leg numbness or weakness, please go to the ED.  See your PCP in about 2-4 weeks.

## 2019-01-31 NOTE — Telephone Encounter (Addendum)
I discussed xray report with her. She will likely benefit from bracing.  I recommend orthopedic evaluation for bracing and management. She agreed with the plan. Referral placed. Again, she denies numbness. She endorsed tingling of her LL. Red flag symptoms and indication for ED visit discussed. Referral to orthopedic placed.  Dg Thoracic Spine 2 View  Result Date: 01/31/2019 CLINICAL DATA:  No inj,chronic upper and lower back pain since 2016,, recently lt leg numbness ,,,,??has scoliosis ,,, Pt signed preg waiver EXAM: THORACIC SPINE 2 VIEWS COMPARISON:  None. FINDINGS: There are 12 rib-bearing vertebral bodies. Mild convex RIGHT scoliosis is present. No hemi vertebrae or other vertebral anomalies. No lytic or blastic lesions. No acute fracture or spondylolisthesis. IMPRESSION: No evidence for acute abnormality. Electronically Signed   By: Nolon Nations M.D.   On: 01/31/2019 15:58   Dg Lumbar Spine Complete  Result Date: 01/31/2019 CLINICAL DATA:  No inj,chronic upper and lower back pain since 2016,,recntly lt leg numbness ,,,,??has scoliosis ,,, Pt signed preg waiver EXAM: LUMBAR SPINE - COMPLETE 4+ VIEW COMPARISON:  None. FINDINGS: There is rotoscoliosis of the lumbar spine, convex to the LEFT. Measured from L2-L4 there is 19 degrees of scoliosis. No hemi vertebrae. No spondylolisthesis or spondylolysis. No lytic or blastic lesions. IMPRESSION: 1. Scoliosis. 2. No evidence for acute abnormality. Electronically Signed   By: Nolon Nations M.D.   On: 01/31/2019 15:57

## 2019-01-31 NOTE — Telephone Encounter (Signed)
Pt had an appointment with Dr. Gwendlyn Deutscher today and was sent to have an xray. Pt has not gotten the results from the xray but was informed she has scoliosis. She is concerned and has scheduled an appointment with pcp for 02/03/19.   Pt is still in pain and would like to know if Dr. Gwendlyn Deutscher would be able write a note to excuse her from work this weekend. Pt would like to pick letter up at the front desk. Please call when ready to be picked up.

## 2019-01-31 NOTE — Assessment & Plan Note (Signed)
+   Radiculopathy. Otherwise, normal neuro exam. Some element of scoliosis found on physical exam may be contributing to her pain. Xray of her spine ordered. Toradol 30 mg IM given during this visit since her pain was aggravated by MSK exam. Start Ibuprofen prn for breakthrough pain and Gabapentin 100 mg TID for neuropathic pain. F/U soon to seem PCP. Red flag signs discussed. Consider MRI in the future if there is no improvement. She agreed with the plan.

## 2019-01-31 NOTE — Telephone Encounter (Signed)
Will forward to Md. Kinsey Karch,CMA  

## 2019-01-31 NOTE — Telephone Encounter (Signed)
Please advise the patient that I don't have the official read of her xray but it does look like she has scoliosis indeed. Advise her to start taking her meds as prescribed and she can come pick up her letter today.   Advise ED visit if symptoms worsens.

## 2019-01-31 NOTE — Telephone Encounter (Signed)
Pt came into office and picked her letter up for work.

## 2019-01-31 NOTE — Telephone Encounter (Signed)
Patient aware and will be by to pick up letter. Shariq Puig,CMA

## 2019-01-31 NOTE — Progress Notes (Signed)
  Subjective:     Patient ID: Helen Strickland, female   DOB: 07/19/1995, 23 y.o.   MRN: [id number]  Back Pain This is a chronic problem. The current episode started more than 1 year ago (4 years ago). The problem occurs intermittently. The problem has been waxing and waning since onset. The pain is present in the lumbar spine. Quality: Sharp. The pain radiates to the left knee and left thigh. The pain is at a severity of 6/10 (With motion it is very painful). The pain is moderate. The symptoms are aggravated by bending, position, standing and stress. Associated symptoms include tingling. Pertinent negatives include no bladder incontinence, bowel incontinence, leg pain, numbness or weakness. (Left leg tingling) Risk factors: This started 4 years ago after her epidural for delivery of her son. Treatments tried: Hot shower. The treatment provided mild relief.   Current Outpatient Medications on File Prior to Visit  Medication Sig Dispense Refill  . aspirin-acetaminophen-caffeine (EXCEDRIN MIGRAINE) 250-250-65 MG tablet Take 1 tablet by mouth every 6 (six) hours as needed for headache or migraine. (Patient not taking: Reported on 01/31/2019) 30 tablet 1   No current facility-administered medications on file prior to visit.    Past Medical History:  Diagnosis Date  . Asthma    exercise induced asthma; "been years since used inhaler"  . Former smoker   . History of marijuana use    not while pregnant-2016    Vitals:   01/31/19 1057  BP: 102/70  Pulse: 95  SpO2: 99%      Review of Systems  Respiratory: Negative.   Cardiovascular: Negative.   Gastrointestinal: Negative.  Negative for bowel incontinence.  Genitourinary: Negative for bladder incontinence.  Musculoskeletal: Positive for back pain.  Neurological: Positive for tingling. Negative for weakness and numbness.  All other systems reviewed and are negative.      Objective:   Physical Exam Vitals signs and nursing note reviewed.   Cardiovascular:     Rate and Rhythm: Normal rate and regular rhythm.     Pulses: Normal pulses.     Heart sounds: Normal heart sounds. No murmur.  Pulmonary:     Effort: Pulmonary effort is normal. No respiratory distress.     Breath sounds: Normal breath sounds. No wheezing.  Abdominal:     General: Abdomen is flat. Bowel sounds are normal. There is no distension.     Palpations: Abdomen is soft. There is no mass.     Tenderness: There is no abdominal tenderness.  Musculoskeletal:     Lumbar back: She exhibits decreased range of motion and tenderness. She exhibits no swelling.       Back:  Neurological:     General: No focal deficit present.     Cranial Nerves: Cranial nerves are intact.     Sensory: Sensation is intact.     Motor: Motor function is intact.     Coordination: Coordination is intact.     Gait: Gait is intact.     Deep Tendon Reflexes: Reflexes are normal and symmetric.   She was crying when I started MSK exam.     Assessment:     Chronic lumbar spine pain Scoliosis    Plan:     Check problem list

## 2019-01-31 NOTE — Addendum Note (Signed)
Addended by: Andrena Mews T on: 01/31/2019 04:24 PM   Modules accepted: Orders

## 2019-01-31 NOTE — Progress Notes (Signed)
Patient ID: Helen Strickland, female   DOB: Sep 29, 1995, 23 y.o.   MRN: [id number] I discussed xray report with her. She will likely benefit from bracing.  I recommend orthopedic evaluation for bracing and management. She agreed with the plan. Referral placed. Again, she denies numbness. She endorsed tingling of her LL. Red flag symptoms and indication for ED visit discussed. Referral to orthopedic placed.  Dg Thoracic Spine 2 View  Result Date: 01/31/2019 CLINICAL DATA:  No inj,chronic upper and lower back pain since 2016,, recently lt leg numbness ,,,,??has scoliosis ,,, Pt signed preg waiver EXAM: THORACIC SPINE 2 VIEWS COMPARISON:  None. FINDINGS: There are 12 rib-bearing vertebral bodies. Mild convex RIGHT scoliosis is present. No hemi vertebrae or other vertebral anomalies. No lytic or blastic lesions. No acute fracture or spondylolisthesis. IMPRESSION: No evidence for acute abnormality. Electronically Signed   By: Nolon Nations M.D.   On: 01/31/2019 15:58   Dg Lumbar Spine Complete  Result Date: 01/31/2019 CLINICAL DATA:  No inj,chronic upper and lower back pain since 2016,,recntly lt leg numbness ,,,,??has scoliosis ,,, Pt signed preg waiver EXAM: LUMBAR SPINE - COMPLETE 4+ VIEW COMPARISON:  None. FINDINGS: There is rotoscoliosis of the lumbar spine, convex to the LEFT. Measured from L2-L4 there is 19 degrees of scoliosis. No hemi vertebrae. No spondylolisthesis or spondylolysis. No lytic or blastic lesions. IMPRESSION: 1. Scoliosis. 2. No evidence for acute abnormality. Electronically Signed   By: Nolon Nations M.D.   On: 01/31/2019 15:57

## 2019-02-03 ENCOUNTER — Ambulatory Visit: Payer: Medicaid Other | Admitting: Family Medicine

## 2019-02-05 ENCOUNTER — Encounter: Payer: Self-pay | Admitting: Family Medicine

## 2019-02-05 ENCOUNTER — Ambulatory Visit (INDEPENDENT_AMBULATORY_CARE_PROVIDER_SITE_OTHER): Payer: Medicaid Other | Admitting: Family Medicine

## 2019-02-05 ENCOUNTER — Other Ambulatory Visit: Payer: Self-pay

## 2019-02-05 DIAGNOSIS — M545 Low back pain: Secondary | ICD-10-CM | POA: Diagnosis not present

## 2019-02-05 DIAGNOSIS — G8929 Other chronic pain: Secondary | ICD-10-CM

## 2019-02-05 MED ORDER — MELOXICAM 15 MG PO TABS: 7.5000 mg | ORAL_TABLET | Freq: Every day | ORAL | 6 refills | Status: DC | PRN

## 2019-02-05 NOTE — Progress Notes (Signed)
   Office Visit Note   Patient: Helen Strickland           Date of Birth: [birth year]           MRN: [id number] Visit Date: 02/05/2019 Requested by: Helen Feil, MD [address] [place],  [place] [address] PCP: Helen Sires, DO  Subjective: Chief Complaint  Patient presents with  . Lower Back - Pain    Excruciating pain in the back since epidural 4 years ago (when she had her son). When she is on her feet all day for a work shift, her back hurts the whole time. Has had xrays Lsp/Tsp.  . Middle Back - Pain    HPI: She is here with low back pain.  She has had pain for many years, it got worse after childbirth 4 years ago when she had an epidural.  Pain occasionally radiates into the left leg with some tingling.  She cannot trace the path of it, the entire leg feels like it tingles.  She has not been to physical therapy or to a chiropractor, she had x-rays done recently showing mild thoracolumbar scoliosis.               ROS: Denies fevers or chills, denies bowel or bladder dysfunction.  All other systems were reviewed and are negative.  Objective: Vital Signs: LMP  (LMP Unknown)   Physical Exam:  General:  Alert and oriented, in no acute distress. Pulm:  Breathing unlabored. Psy:  Normal mood, congruent affect. Skin: No visible rash. Back: She has mild thoracolumbar scoliosis.  She is tender mainly in the midline at the L5-S1 level.  No significant pain over the SI joints or in the sciatic notch, negative straight leg raise, lower extremity strength and reflexes are normal.  Imaging: None today.  Recent x-rays show mild thoracolumbar scoliosis.  Assessment & Plan: 1.  Chronic low back pain with occasional left-sided paresthesias, possible lumbar disc protrusion.  Neurologic exam is nonfocal today. -Referral to physical therapy.  Meloxicam as needed.  MRI scan ordered, but if not approved, she will go through physical therapy first.     Procedures: No procedures  performed  No notes on file     PMFS History: Patient Active Problem List   Diagnosis Date Noted  . Lumbar pain 01/31/2019  . Abdominal cramping 09/20/2016  . Contraception management 04/06/2016   Past Medical History:  Diagnosis Date  . Asthma    exercise induced asthma; "been years since used inhaler"  . Former smoker   . History of marijuana use    not while pregnant-2016     Family History  Problem Relation Age of Onset  . Anxiety disorder Mother   . Depression Mother   . Post-traumatic stress disorder Father   . Anxiety disorder Sister   . Depression Sister   . Alcohol abuse Neg Hx     Past Surgical History:  Procedure Laterality Date  . NO PAST SURGERIES     Social History   Occupational History  . Not on file  Tobacco Use  . Smoking status: Current Some Day Smoker    Types: Cigarettes  . Smokeless tobacco: Never Used  Substance and Sexual Activity  . Alcohol use: No    Comment: social  . Drug use: Yes    Types: Marijuana    Comment: use daily   . Sexual activity: Yes

## 2019-02-06 ENCOUNTER — Ambulatory Visit (INDEPENDENT_AMBULATORY_CARE_PROVIDER_SITE_OTHER): Payer: Medicaid Other | Admitting: Family Medicine

## 2019-02-06 ENCOUNTER — Encounter: Payer: Self-pay | Admitting: Family Medicine

## 2019-02-06 ENCOUNTER — Other Ambulatory Visit: Payer: Self-pay

## 2019-02-06 DIAGNOSIS — M4126 Other idiopathic scoliosis, lumbar region: Secondary | ICD-10-CM | POA: Diagnosis not present

## 2019-02-06 DIAGNOSIS — M419 Scoliosis, unspecified: Secondary | ICD-10-CM | POA: Insufficient documentation

## 2019-02-06 NOTE — Progress Notes (Signed)
   Subjective: Helen Strickland is a 23 yr old female here to have a second opinion for her scoliosis management     Patient ID: Helen Strickland, female    DOB: May 07, 1996, 23 y.o.   MRN: [id number]   CC: Back pain secondary to Scoliosis     Scoliosis Pt has had a recent diagnosis of scoliosis. Has had long standing history of back pain which worsening after epidural 4 yrs ago Pt reports intermittent, sharp lumbar pain radiating to left buttock sometimes. Feels "tingling sensation" in left leg sometimes. 10/10 severity when very bad. 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 Currently takes gabapentin and Ibuprofen which only slightly controls the pain. Relieved by rest and hot showers. Denies saddle anaesthesia. Xray result discussed with Dr Gwendlyn Deutscher on 7/24 who suggested brace may help support abdomen and also referred to orthopedic doctor. Had clinic appointment yesterday with Ortho Dr who suggested physical therapy and suggested she did not require a brace. Also said she did not need an MRI of lumbar spine at the moment. Patient would like a second opinion to ensure she is treating her scoliosis correctly.   Otherwise well. Mentioned she thinks she has hemorrhoids recently. This problem was not addressed on this clinic visit.   Smoking status reviewed   ROS: pertinent noted in the HPI    Past medical history, surgical, family, and social history reviewed and updated in the EMR as appropriate. Reviewed problem list.   Objective:  BP 135/82   Pulse 69   Temp 98 F (36.7 C) (Oral)   Wt 120 lb (54.4 kg)   LMP 02/01/2019   SpO2 99%   BMI 19.97 kg/m   Vitals and nursing note reviewed  General: NAD, pleasant, able to participate in exam Cardiac: - Respiratory: - MSK:  Mild scoliosis in lumbar vertebra. Negative slump test, negative straight leg test, no pain on palpation of whole spine. 5/5 strength in lower extremities, DTR 2+, normal sensation of lower  extremity and normal heel to shin test Extremities: no edema or cyanosis. Skin: warm and dry, no rashes noted Neuro: alert, no obvious focal deficits Psych: Normal affect and mood   Assessment & Plan:    Scoliosis Scoliosis Pt's scoliosis is contributing to her back pain and symptoms in her left leg. Counselled pt with Dr Sheppard Coil that physical therapy and exercises such as yoga and pilates will be very beneficial to increase core abdominal strength which will help support her spine. This may take a period of months and she may not see immediate results. Explained that MRI spine is unlikely to help in the management of the scoliosis unless she would like spinal injections or requires surgery. She agrees that she does not meet those criteria. Plan is to start physical therapy and to see me in a few months. However pt can see me if back pain is worsening.    Lattie Haw, MD  [place] PGY-1

## 2019-02-06 NOTE — Patient Instructions (Signed)
Ms Helen Strickland, [name] were seen for your back pain in clinic today. We reviewed your xray images and I explained what scoliosis is. We discussed the fact that an abdominal brace may not help you with your scoliosis. It is better you take part in physical therapy and exercises like yoga and pilates which will help strengthen your core abdominal muscles. This is not an instant fix and will take some months. You can also try heat packs, ice packs and continue to take your NSAIDs and gabapentin to manage the pain.  Please come and see me again if your back pain is worsening or not improving after physical therapy.  Kind regards,  Dr Lattie Haw, MD PGY 1

## 2019-02-06 NOTE — Assessment & Plan Note (Signed)
Scoliosis Pt's scoliosis is contributing to her back pain and symptoms in her left leg. Counselled pt with Dr Sheppard Coil that physical therapy and exercises such as yoga and pilates will be very beneficial to increase core abdominal strength which will help support her spine. This may take a period of months and she may not see immediate results. Explained that MRI spine is unlikely to help in the management of the scoliosis unless she would like spinal injections or requires surgery. She agrees that she does not meet those criteria. Plan is to start physical therapy and to see me in a few months. However pt can see me if back pain is worsening.

## 2020-01-13 ENCOUNTER — Telehealth: Payer: Self-pay

## 2020-01-13 NOTE — Telephone Encounter (Signed)
Patient calls nurse line requesting note for work stating that she needs to have increased breaks at work due to scoliosis diagnosis. Patient states that she has received this in the past from Dr. Lum Babe, however, she needs updated letter.   Forwarding to PCP.   Veronda Prude, RN

## 2020-01-15 ENCOUNTER — Encounter: Payer: Self-pay | Admitting: Family Medicine

## 2020-01-15 NOTE — Progress Notes (Signed)
I attempted to call this patient.  A note has been completed similar to her previous note from Dr. Lum Babe.  She did not answer.  I will place her work note in the folder in the front office for her to come pick up.

## 2020-04-29 ENCOUNTER — Other Ambulatory Visit: Payer: Self-pay

## 2020-04-29 ENCOUNTER — Encounter (HOSPITAL_COMMUNITY): Payer: Self-pay

## 2020-04-29 DIAGNOSIS — F17210 Nicotine dependence, cigarettes, uncomplicated: Secondary | ICD-10-CM | POA: Diagnosis not present

## 2020-04-29 DIAGNOSIS — K0889 Other specified disorders of teeth and supporting structures: Secondary | ICD-10-CM | POA: Diagnosis not present

## 2020-04-29 DIAGNOSIS — Z5321 Procedure and treatment not carried out due to patient leaving prior to being seen by health care provider: Secondary | ICD-10-CM | POA: Insufficient documentation

## 2020-04-29 DIAGNOSIS — J45909 Unspecified asthma, uncomplicated: Secondary | ICD-10-CM | POA: Diagnosis not present

## 2020-04-29 DIAGNOSIS — Z7982 Long term (current) use of aspirin: Secondary | ICD-10-CM | POA: Insufficient documentation

## 2020-04-29 MED ORDER — IBUPROFEN 200 MG PO TABS
400.0000 mg | ORAL_TABLET | Freq: Once | ORAL | Status: AC | PRN
  Administered 2020-04-29: 400 mg via ORAL
  Filled 2020-04-29: qty 2

## 2020-04-29 NOTE — ED Triage Notes (Signed)
Patient states over the last week she had upper and lower dental pain. Now reports having left sided facial pain.

## 2020-04-30 ENCOUNTER — Emergency Department (HOSPITAL_COMMUNITY)
Admission: EM | Admit: 2020-04-30 | Discharge: 2020-04-30 | Disposition: A | Discharge status: Home / Self Care | Payer: Medicaid Other | Source: Home / Self Care | Attending: Emergency Medicine | Admitting: Emergency Medicine

## 2020-04-30 ENCOUNTER — Emergency Department (HOSPITAL_COMMUNITY)
Admission: EM | Admit: 2020-04-30 | Discharge: 2020-04-30 | Disposition: A | Discharge status: Home / Self Care | Payer: Medicaid Other | Attending: Emergency Medicine | Admitting: Emergency Medicine

## 2020-04-30 ENCOUNTER — Encounter (HOSPITAL_COMMUNITY): Payer: Self-pay | Admitting: *Deleted

## 2020-04-30 DIAGNOSIS — Z7982 Long term (current) use of aspirin: Secondary | ICD-10-CM | POA: Insufficient documentation

## 2020-04-30 DIAGNOSIS — K0889 Other specified disorders of teeth and supporting structures: Secondary | ICD-10-CM | POA: Diagnosis not present

## 2020-04-30 DIAGNOSIS — J45909 Unspecified asthma, uncomplicated: Secondary | ICD-10-CM | POA: Insufficient documentation

## 2020-04-30 DIAGNOSIS — R52 Pain, unspecified: Secondary | ICD-10-CM | POA: Diagnosis not present

## 2020-04-30 DIAGNOSIS — F17210 Nicotine dependence, cigarettes, uncomplicated: Secondary | ICD-10-CM | POA: Insufficient documentation

## 2020-04-30 MED ORDER — LIDOCAINE VISCOUS HCL 2 % MT SOLN: 15.0000 mL | OROMUCOSAL | 0 refills | Status: DC | PRN

## 2020-04-30 MED ORDER — KETOROLAC TROMETHAMINE 15 MG/ML IJ SOLN
15.0000 mg | Freq: Once | INTRAMUSCULAR | Status: AC
  Administered 2020-04-30: 15 mg via INTRAMUSCULAR
  Filled 2020-04-30: qty 1

## 2020-04-30 MED ORDER — AMOXICILLIN-POT CLAVULANATE 875-125 MG PO TABS: 1.0000 | ORAL_TABLET | Freq: Two times a day (BID) | ORAL | 0 refills | Status: DC

## 2020-04-30 MED ORDER — LIDOCAINE VISCOUS HCL 2 % MT SOLN
15.0000 mL | Freq: Once | OROMUCOSAL | Status: AC
  Administered 2020-04-30: 15 mL via OROMUCOSAL
  Filled 2020-04-30: qty 15

## 2020-04-30 MED ORDER — NAPROXEN 500 MG PO TABS: 500.0000 mg | ORAL_TABLET | Freq: Two times a day (BID) | ORAL | 0 refills | Status: DC

## 2020-04-30 NOTE — ED Provider Notes (Signed)
[facility]-EMERGENCY DEPT Provider Note   CSN: [id number] Arrival date & time: 04/30/20  8588     History Chief Complaint  Patient presents with  . Dental Pain    Helen Strickland is a 24 y.o. female presenting for evaluation of dental pain.  Patient states is been going on for a week, more severe in the past 2 days.  Pain is on the left side, both upper and lower.  She denies associated fevers or chills.  She has been taking ibuprofen and BC without improvement, last ibuprofen was over a day ago, last BC was on 4:00 this morning.  She denies difficulty opening her mouth or swallowing her spit.  She has an appoint with a dentist next week.  She denies previous issues with her teeth.  She feels her wisdom teeth are coming in.  She has no other medical problems, takes no medications daily. Pain is constant, nothing makes it better.    HPI     Past Medical History:  Diagnosis Date  . Asthma    exercise induced asthma; "been years since used inhaler"  . Former smoker   . History of marijuana use    not while pregnant-2016     Patient Active Problem List   Diagnosis Date Noted  . Scoliosis 02/06/2019  . Lumbar pain 01/31/2019  . Abdominal cramping 09/20/2016  . Contraception management 04/06/2016    Past Surgical History:  Procedure Laterality Date  . NO PAST SURGERIES       OB History    Gravida  1   Para  1   Term      Preterm  1   AB      Living  1     SAB      TAB      Ectopic      Multiple  0   Live Births  1           Family History  Problem Relation Age of Onset  . Anxiety disorder Mother   . Depression Mother   . Post-traumatic stress disorder Father   . Anxiety disorder Sister   . Depression Sister   . Alcohol abuse Neg Hx     Social History   Tobacco Use  . Smoking status: Current Some Day Smoker    Types: Cigarettes  . Smokeless tobacco: Never Used  Vaping Use  . Vaping Use: Never used  Substance  Use Topics  . Alcohol use: No    Comment: social  . Drug use: Yes    Types: Marijuana    Comment: use daily     Home Medications Prior to Admission medications   Medication Sig Start Date End Date Taking? Authorizing Provider  amoxicillin-clavulanate (AUGMENTIN) 875-125 MG tablet Take 1 tablet by mouth every 12 (twelve) hours. 04/30/20   Tagen Milby, PA-C  aspirin-acetaminophen-caffeine (EXCEDRIN MIGRAINE) [phone] MG tablet Take 1 tablet by mouth every 6 (six) hours as needed for headache or migraine. Patient not taking: Reported on 01/31/2019 10/03/16   Pincus Large, DO  gabapentin (NEURONTIN) 100 MG capsule Take 1 capsule (100 mg total) by mouth 3 (three) times daily. 01/31/19   Doreene Eland, MD  ibuprofen (ADVIL) 400 MG tablet Take 1 tablet (400 mg total) by mouth every 8 (eight) hours as needed. 01/31/19   Doreene Eland, MD  lidocaine (XYLOCAINE) 2 % solution Use as directed 15 mLs in the mouth or throat as needed for mouth pain.  04/30/20   Damiah Mcdonald, PA-C  meloxicam (MOBIC) 15 MG tablet Take 0.5-1 tablets (7.5-15 mg total) by mouth daily as needed for pain. 02/05/19   Hilts, Casimiro Needle, MD  naproxen (NAPROSYN) 500 MG tablet Take 1 tablet (500 mg total) by mouth 2 (two) times daily with a meal. 04/30/20   Exa Bomba, PA-C    Allergies    Patient has no known allergies.  Review of Systems   Review of Systems  Constitutional: Negative for fever.  HENT: Positive for dental problem.     Physical Exam Updated Vital Signs BP 135/85 (BP Location: Left Arm)   Pulse (!) 109   Temp 98.9 F (37.2 C) (Oral)   Resp 18   LMP 04/29/2020   SpO2 96%   Physical Exam Vitals and nursing note reviewed.  Constitutional:      General: She is not in acute distress.    Appearance: She is well-developed.  HENT:     Head: Normocephalic and atraumatic.     Comments: Gingival edema of L upper teeth. Ttp. No palpable abscess. No trismus. Handling secretions easily.    Cardiovascular:     Rate and Rhythm: Normal rate and regular rhythm.     Pulses: Normal pulses.  Pulmonary:     Effort: Pulmonary effort is normal.  Abdominal:     General: There is no distension.  Musculoskeletal:        General: Normal range of motion.     Cervical back: Normal range of motion.  Skin:    General: Skin is warm.     Findings: No rash.  Neurological:     Mental Status: She is alert and oriented to person, place, and time.     ED Results / Procedures / Treatments   Labs (all labs ordered are listed, but only abnormal results are displayed) Labs Reviewed - No data to display  EKG None  Radiology No results found.  Procedures Procedures (including critical care time)  Medications Ordered in ED Medications  ketorolac (TORADOL) 15 MG/ML injection 15 mg (15 mg Intramuscular Given 04/30/20 0944)  lidocaine (XYLOCAINE) 2 % viscous mouth solution 15 mL (15 mLs Mouth/Throat Given 04/30/20 0944)    ED Course  I have reviewed the triage vital signs and the nursing notes.  Pertinent labs & imaging results that were available during my care of the patient were reviewed by me and considered in my medical decision making (see chart for details).    MDM Rules/Calculators/A&P                          Patient presented for evaluation of dental pain.  On exam, there is gingival edema, and with worsened pain over the past 2 days, concern for possible infection.  Exam not consistent with Ludwig. Will cover with antibiotics, and treat with NSAIDs and viscous lidocaine.  Encourage patient to follow-up with her dentist at scheduled appointment next week.  At this time, patient appears safe for discharge.  Return precautions given.  Patient states she understands and agrees to plan.  Final Clinical Impression(s) / ED Diagnoses Final diagnoses:  Pain, dental    Rx / DC Orders ED Discharge Orders         Ordered    amoxicillin-clavulanate (AUGMENTIN) 875-125 MG tablet   Every 12 hours        04/30/20 0939    naproxen (NAPROSYN) 500 MG tablet  2 times daily with meals  04/30/20 0939    lidocaine (XYLOCAINE) 2 % solution  As needed        04/30/20 0939           Alveria Apley, PA-C 04/30/20 1059    Linwood Dibbles, MD 05/03/20 [id number]

## 2020-04-30 NOTE — Discharge Instructions (Addendum)
Take antibiotics as prescribed.  Take entire course, even if your symptoms improve. Take naproxen 2 times a day with meals.  Do not take other anti-inflammatories at the same time (Advil, Motrin, ibuprofen, Aleve, BC powder). You may supplement with Tylenol if you need further pain control. Use viscous lidocaine to numb your mouth to help with pain control. Follow-up with your dentist after scheduled appointment next week. Return to the emergency room if you develop high fevers, inability to open your mouth, difficulty swallowing her saliva, any new, worsening, concerning symptoms.

## 2020-04-30 NOTE — ED Triage Notes (Addendum)
BIB EMS with dental pain times 2 days. Come in last night but did not wait. Upper and lower dental pain, has dental appointment on Monday.  Fever and pain symptoms as well as pain in whole Left side of head

## 2020-05-03 ENCOUNTER — Telehealth: Payer: Self-pay

## 2020-05-03 NOTE — Telephone Encounter (Signed)
04/30/2020 Helen Strickland   Transition Care Management Unsuccessful Follow-up Telephone Call  Date of discharge and from where:  04/30/2020 Helen Strickland  Attempts:  1st Attempt  Reason for unsuccessful TCM follow-up call:  Unable to leave message

## 2020-06-10 ENCOUNTER — Ambulatory Visit: Payer: Medicaid Other | Admitting: Family Medicine

## 2020-06-11 ENCOUNTER — Ambulatory Visit: Payer: Medicaid Other

## 2021-02-08 NOTE — Progress Notes (Addendum)
  Patient Name: Helen Strickland Date of Birth: 05-21-1996 [facility] Initial Prenatal Visit  Helen Strickland is a 25 y.o. year old G1P0101 at Unknown who presents for her initial prenatal visit. Pregnancy is not planned She reports fatigue and morning sickness. She is not taking a prenatal vitamin.  She denies pelvic pain or vaginal bleeding.   Pregnancy Dating: The patient is dated by Ultrasound. 11 weeks & 6 days  LMP: May 20th Period is certain:  Yes.  Periods were regular:  No.  LMP was a typical period:  No.  Using hormonal contraception in 3 months prior to conception: No  Lab Review: Labs pending  PMH: Reviewed and as detailed below: HTN: No  Gestational Hypertension/preeclampsia: No  Type 1 or 2 Diabetes: No  Depression:  No  Seizure disorder:  No VTE: No ,  History of STI Yes, Trichomonas Abnormal Pap smear:  No, Genital herpes simplex:  No   PSH: Gynecologic Surgery:  no Obstetric History: Obstetric history tab updated and reviewed.  Summary of prior pregnancies: G3P0111 Cesarean delivery: No  Gestational Diabetes:  No Hypertension in pregnancy: No History of preterm birth: Yes History of LGA/SGA infant:  No History of shoulder dystocia: No Indications for referral were reviewed, and the patient has no obstetric indications for referral to High Risk OB Clinic at this time.   Social History: Tobacco use: Yes Uses tobacco wraps for smoking THC Alcohol use:  No Other substance use:  No  Current Medications:  None  Reviewed and appropriate in pregnancy.   Genetic and Infection Screen: Flow Sheet Updated No  Prenatal Exam: Gen: Well nourished, well developed.  No distress.  Vitals noted. HEENT: Normocephalic, atraumatic.  Neck supple without cervical lymphadenopathy, thyromegaly or thyroid nodules.  Fair dentition. CV: RRR no murmur, gallops or rubs Lungs: CTA B.  Normal respiratory effort without wheezes or rales. Abd: soft, NTND.  +BS.  Uterus not appreciated above pelvis. Ext: No clubbing, cyanosis or edema. Psych: Normal grooming and dress.  Not depressed or anxious appearing.  Normal thought content and process without flight of ideas or looseness of associations  Fetal heart tones: Appropriate captured on Ultrasound  Assessment/Plan:  Helen Strickland is a 25 y.o. G1P0101 at Unknown who presents to initiate prenatal care. She is doing well.   Routine prenatal care: As dating is not reliable, a dating ultrasound has been performed. Dating tab updated. Pre-pregnancy weight updated. Expected weight gain this pregnancy is 25-35 pounds  Prenatal labs will be reviewed once they result.  Indications for referral to [facility] were reviewed and the patient does meet criteria for referral.  Medication list reviewed and updated.  Recommended patient see a dentist for regular care.  Bleeding and pain precautions reviewed. Importance of prenatal vitamins reviewed.  Genetic screening offered. Patient opted for: no screening. The patient does have an indication for aspirin therapy beginning at 12-16 weeks. Aspirin was  recommended today.  The patient will not be age 40 or over at time of delivery. Referral to genetic counseling was not offered today.  Pregnancy Medical Home and PHQ-9 forms completed, problems noted: Yes  2. Pregnancy issues include the following which were addressed today:  Need for PAP smear and GC/Chlamydia testing   Follow up 4 weeks for next prenatal visit.

## 2021-02-09 ENCOUNTER — Other Ambulatory Visit: Payer: Self-pay

## 2021-02-09 ENCOUNTER — Ambulatory Visit: Payer: Medicaid Other | Admitting: Student

## 2021-02-09 ENCOUNTER — Encounter: Payer: Self-pay | Admitting: Student

## 2021-02-09 VITALS — BP 113/73 | HR 94 | Wt 125.4 lb

## 2021-02-09 DIAGNOSIS — J45990 Exercise induced bronchospasm: Secondary | ICD-10-CM | POA: Diagnosis not present

## 2021-02-09 DIAGNOSIS — N912 Amenorrhea, unspecified: Secondary | ICD-10-CM

## 2021-02-09 DIAGNOSIS — O09899 Supervision of other high risk pregnancies, unspecified trimester: Secondary | ICD-10-CM

## 2021-02-09 DIAGNOSIS — Z3481 Encounter for supervision of other normal pregnancy, first trimester: Secondary | ICD-10-CM | POA: Diagnosis not present

## 2021-02-09 LAB — POCT URINE PREGNANCY: Preg Test, Ur: POSITIVE — AB

## 2021-02-09 MED ORDER — ALBUTEROL SULFATE HFA 108 (90 BASE) MCG/ACT IN AERS: 2.0000 | INHALATION_SPRAY | Freq: Four times a day (QID) | RESPIRATORY_TRACT | 2 refills | Status: DC | PRN

## 2021-02-09 MED ORDER — PRENATAL 28-0.8 MG PO TABS: 1.0000 | ORAL_TABLET | ORAL | 4 refills | Status: AC

## 2021-02-09 NOTE — Patient Instructions (Signed)
It was great to see you! Thank you for allowing me to participate in your care!   Our plans for today:  - Medication:   Start Asprin 81 mg, once daily, at 12 weeks  Start prenatal vitamin daily  Albuterol inhaler (use as needed)  - OB care  Referral to [facility] for management of pregnancy  Make an appointment with our clinic in 4 weeks, incase you are not seen by OB in time  Get PAP smear with Gonorrhea/Chlamydia testing at next appointment    We are checking some labs today, I will call you if they are abnormal will send you a MyChart message or a letter if they are normal.  If you do not hear about your labs in the next 2 weeks please let us know.  Take care and seek immediate care sooner if you develop any concerns.   Dr. Bess Kinds, MD [facility]

## 2021-02-11 LAB — CULTURE, OB URINE

## 2021-02-11 LAB — URINE CULTURE, OB REFLEX

## 2021-02-16 LAB — OBSTETRIC PANEL, INCLUDING HIV
Basophils Absolute: 0 10*3/uL (ref 0.0–0.2)
Basos: 0 %
EOS (ABSOLUTE): 0 10*3/uL (ref 0.0–0.4)
Eos: 0 %
HIV Screen 4th Generation wRfx: NONREACTIVE
Hematocrit: 35.4 % (ref 34.0–46.6)
Hemoglobin: 12.4 g/dL (ref 11.1–15.9)
Hepatitis B Surface Ag: NEGATIVE
Immature Grans (Abs): 0 10*3/uL (ref 0.0–0.1)
Immature Granulocytes: 0 %
Lymphocytes Absolute: 1.8 10*3/uL (ref 0.7–3.1)
Lymphs: 26 %
MCH: 31.3 pg (ref 26.6–33.0)
MCHC: 35 g/dL (ref 31.5–35.7)
MCV: 89 fL (ref 79–97)
Monocytes Absolute: 0.4 10*3/uL (ref 0.1–0.9)
Monocytes: 6 %
Neutrophils Absolute: 4.7 10*3/uL (ref 1.4–7.0)
Neutrophils: 68 %
Platelets: 320 10*3/uL (ref 150–450)
RBC: 3.96 x10E6/uL (ref 3.77–5.28)
RDW: 12.9 % (ref 11.7–15.4)
RPR Ser Ql: NONREACTIVE
Rh Factor: POSITIVE
Rubella Antibodies, IGG: 28.8 index (ref >0.99)
WBC: 7 10*3/uL (ref 3.4–10.8)

## 2021-02-16 LAB — HGB FRACTIONATION BY HPLC
Hgb A2: 1.7 % — ABNORMAL LOW (ref 1.8–3.2)
Hgb A: 97.1 % (ref 96.4–98.8)
Hgb C: 0 %
Hgb E: 0 %
Hgb F: 0 % (ref 0.0–2.0)
Hgb S: 0 %
Hgb Variant: 1.2 % — ABNORMAL HIGH

## 2021-02-16 LAB — AB SCR+ANTIBODY ID: Antibody Screen: POSITIVE — AB

## 2021-02-16 LAB — HCV AB W REFLEX TO QUANT PCR: HCV Ab: <0.1 s/co ratio (ref 0.0–0.9)

## 2021-02-16 LAB — HGB FRACTIONATION CASCADE

## 2021-02-16 LAB — HCV INTERPRETATION

## 2021-03-09 ENCOUNTER — Encounter: Payer: Medicaid Other | Admitting: Obstetrics

## 2021-04-20 ENCOUNTER — Other Ambulatory Visit: Payer: Self-pay

## 2021-04-20 ENCOUNTER — Ambulatory Visit (INDEPENDENT_AMBULATORY_CARE_PROVIDER_SITE_OTHER): Payer: Medicaid Other | Admitting: Family Medicine

## 2021-04-20 ENCOUNTER — Other Ambulatory Visit (HOSPITAL_COMMUNITY)
Admission: RE | Admit: 2021-04-20 | Discharge: 2021-04-20 | Disposition: A | Discharge status: Home / Self Care | Payer: Medicaid Other | Source: Ambulatory Visit | Attending: Family Medicine | Admitting: Family Medicine

## 2021-04-20 VITALS — BP 107/96 | Wt 141.1 lb

## 2021-04-20 DIAGNOSIS — O0932 Supervision of pregnancy with insufficient antenatal care, second trimester: Secondary | ICD-10-CM

## 2021-04-20 DIAGNOSIS — O0992 Supervision of high risk pregnancy, unspecified, second trimester: Secondary | ICD-10-CM | POA: Diagnosis not present

## 2021-04-20 DIAGNOSIS — Z3A20 20 weeks gestation of pregnancy: Secondary | ICD-10-CM | POA: Diagnosis not present

## 2021-04-20 NOTE — Patient Instructions (Addendum)
It was wonderful seeing you today.  I have ordered a dating ultrasound as well as an anatomy scan to take a look at your baby.  We are also going to get you scheduled with high risk OB and please do not miss this appointment.  If you have any vaginal bleeding, gush of fluid, contractions please be seen at the maternal assessment unit.  I hope you have a wonderful afternoon!

## 2021-04-20 NOTE — Progress Notes (Addendum)
  Patient Name: Helen Strickland Date of Birth: [birth year] [facility] Prenatal Visit  Helen Strickland is a 25 y.o. G2P0101 at [redacted]w[redacted]d here for routine follow up.  Patient is unsure about her dating.  She reports that she went to pregnancy network and verified that she was pregnant and had an ultrasound and was told that her due date was 08/24/21.  She does not have any records for this.  She reports she does not know when her last period was but thinks it was around 11/26/2020 putting her due date at 09/02/2021.  Given the discrepancy she will need a dating ultrasound.  She reports no bleeding, no contractions, no cramping, and no leaking.  She denies vaginal bleeding.  See flow sheet for details.  Vitals:   04/20/21 1343  BP: (!) 107/96     A/P: Pregnancy at [redacted]w[redacted]d.  Doing well.    Routine Prenatal Care:  Dating reviewed, dating tab is incorrect, discussed with preceptor and patient, updated appropriately.  Fetal heart tones Appropriate 138 BPM Influenza vaccine not administered as patient declined, will continue to discuss.   COVID vaccination was discussed and not interested .  The patient has the following indication for screening preexisting diabetes: Reviewed indications for early 1 hour glucose testing, not indicated . Anatomy ultrasound along with dating ultrasound ordered and scheduled Patient is not interested in genetic screening.  Pregnancy education including expected weight gain in pregnancy, OTC medication use, continued use of prenatal vitamin, smoking cessation if applicable, and nutrition in pregnancy.   Bleeding and pain precautions reviewed. The patient has the following indications for aspirinto begin 81 mg at 12-16 weeks: One high risk condition: no single high risk condition  MORE than one moderate risk condition: identifies as African American  Aspirin was not  recommended today based upon above risk factors (one high risk condition or more than one  moderate risk factor)   2. Pregnancy issues include the following and were addressed as appropriate today:  Unclear dating-patient is unsure of her LMP and has provided a possible EDD but has no records to support this.  Anatomy scan and dating ultrasound ordered today and scheduled for Friday 10/14 at 7:15 AM. GC/chlamydia testing was obtained today with pelvic exam.  Planned on performing Pap as well but cervix was posterior facing so unable to get today.  She will need a Pap smear after pregnancy. Positive antibody screen-patient had positive antibody screen previously.  It was recommended that repeat antibody screen be .collected in 4 weeks.  It was collected today. Patient with previous preterm delivery at 35 weeks and 2 days.She was previously referred to high risk OB but no showed her appointment because she reports she was unaware of the appointment.  We are going to call  and reschedule this appointment for her. Elevated diastolic blood pressure-intended to repeat blood pressure prior to patient leaving the clinic but this did not occur.  I have attempted to call patient to schedule blood pressure check in the next week but she did not answer.  I have sent a message to my nursing team to reach out to the patient again to schedule this appointment. Problem list  and pregnancy box updated: Yes.   Follow up 4 weeks.

## 2021-04-21 ENCOUNTER — Telehealth: Payer: Self-pay

## 2021-04-21 LAB — CERVICOVAGINAL ANCILLARY ONLY
Chlamydia: NEGATIVE
Comment: NEGATIVE
Comment: NORMAL
Neisseria Gonorrhea: NEGATIVE

## 2021-04-21 NOTE — Telephone Encounter (Signed)
Spoke with patient informed of appt for tomorrow Oct 14th at 7:15 to have ultrasound done and appt at [facility] on Nov. 9th at 2:30. Patient understood but wanted a message sent through Mychart as well if possible will send a reminder of appointments through Mychart. Aquilla Solian, CMA

## 2021-04-22 ENCOUNTER — Other Ambulatory Visit: Payer: Self-pay

## 2021-04-22 ENCOUNTER — Telehealth: Payer: Self-pay

## 2021-04-22 ENCOUNTER — Ambulatory Visit: Payer: Medicaid Other

## 2021-04-22 LAB — ANTIBODY SCREEN

## 2021-04-22 LAB — AB SCR+ANTIBODY ID

## 2021-04-22 NOTE — Telephone Encounter (Signed)
Called, LVM asking for a return call to our office to schedule a repeat BP. Asked to let whomever answers the phone know a nurse visit is needed next week. Sunday Spillers, CMA

## 2021-04-22 NOTE — Telephone Encounter (Signed)
-----   Message from Derrel Nip, MD sent at 04/21/2021  5:18 PM EDT ----- Elvina Sidle, I saw this patient in [facility] clinic yesterday.  We were supposed to get a repeat blood pressure on her before she left because her diastolic was so elevated.  There is no way the diastolic was accurate because her pulse pressure would have been less than 10 but we need to repeat blood pressure.  I was also hoping she was going to be able to get her initial OB appointment with high risk OB soon but it has been scheduled for next month.  I tried to call her to schedule a nurse visit just for blood pressure check early next week but she did not answer.  Could someone please give her a call and schedule her for blood pressure check.  Thank you!

## 2021-04-27 ENCOUNTER — Telehealth: Payer: Self-pay | Admitting: Family Medicine

## 2021-04-27 ENCOUNTER — Other Ambulatory Visit: Payer: Self-pay | Admitting: *Deleted

## 2021-04-27 ENCOUNTER — Ambulatory Visit: Payer: Medicaid Other | Attending: Obstetrics and Gynecology | Admitting: Obstetrics and Gynecology

## 2021-04-27 ENCOUNTER — Other Ambulatory Visit: Payer: Self-pay | Admitting: Family Medicine

## 2021-04-27 ENCOUNTER — Other Ambulatory Visit: Payer: Self-pay

## 2021-04-27 ENCOUNTER — Ambulatory Visit: Payer: Medicaid Other | Attending: Family Medicine

## 2021-04-27 DIAGNOSIS — O09892 Supervision of other high risk pregnancies, second trimester: Secondary | ICD-10-CM | POA: Diagnosis not present

## 2021-04-27 DIAGNOSIS — Z3A22 22 weeks gestation of pregnancy: Secondary | ICD-10-CM | POA: Diagnosis not present

## 2021-04-27 DIAGNOSIS — O283 Abnormal ultrasonic finding on antenatal screening of mother: Secondary | ICD-10-CM

## 2021-04-27 DIAGNOSIS — Z362 Encounter for other antenatal screening follow-up: Secondary | ICD-10-CM

## 2021-04-27 DIAGNOSIS — O3432 Maternal care for cervical incompetence, second trimester: Secondary | ICD-10-CM | POA: Diagnosis not present

## 2021-04-27 DIAGNOSIS — O0992 Supervision of high risk pregnancy, unspecified, second trimester: Secondary | ICD-10-CM

## 2021-04-27 DIAGNOSIS — O09899 Supervision of other high risk pregnancies, unspecified trimester: Secondary | ICD-10-CM

## 2021-04-27 MED ORDER — PROGESTERONE 200 MG VA SUPP: 200.0000 mg | Freq: Every day | VAGINAL | 0 refills | Status: DC

## 2021-04-27 NOTE — Telephone Encounter (Signed)
Spoke with Dr. Judeth Cornfield from maternal-fetal medicine regarding this patient.  She is scheduled to see high risk OB in a few weeks.  She had her ultrasound performed by maternal-fetal medicine today and was found to have a short cervix.  She does have history of preterm delivery at 35 weeks.  Maternal-fetal medicine recommended a cerclage but patient declined.  He recommended that if she not get the cerclage she should at least be on Prometrium 200 mg nightly until [redacted] weeks gestation.  Dr. Judeth Cornfield was having difficulty with the prescriptions I have placed this order.  I greatly appreciate their assistance in caring for this patient.

## 2021-04-27 NOTE — Progress Notes (Signed)
Maternal-Fetal Medicine   Name: Helen Strickland DOB: 08/22/1995 MRN: [id number] Referring Provider: Derrel Nip, MD  I had the pleasure of [facility] Helen [facility]. She is G2 P0101 at 22-weeks' gestation and is here for fetal anatomy scan.  She established her prenatal care last week with family medicine.  Patient reports she had ultrasound performed at a pregnancy network center but we do not have the reports available with [place] now.  She is unsure of her LMP date.  Obstetric history is significant for a spontaneous preterm vaginal delivery of a female infant in 2016 at [redacted] weeks gestation.  At [redacted] weeks gestation, she was admitted to the hospital with diagnosis of preterm labor.  Cervical length measurement on ultrasound was 1.2 cm.  GYN history: No history of abnormal Pap smears or cervical surgeries.  No history of breast disease. Past medical history: No history of hypertension or diabetes or any other chronic medical conditions. Past surgical history: Nil of note. Allergies: No known drug allergies. Medications: Prenatal vitamins. Social: Denies tobacco or drug or alcohol use. Her partner is Tree surgeon. Family: Mother has epilepsy.  Ultrasound We performed fetal anatomical survey.  Fetal biometry is consistent with 22 weeks and 4 days gestation.  Amniotic fluid is normal good fetal activity seen.  An echogenic intracardiac focus is seen.  No other markers of aneuploidies or fetal structural defects are seen. Because of a history of cervical shortening and previous pregnancy and preterm delivery, we performed a transvaginal ultrasound.  The shortest and best cervical length measurement is 2.1 cm.  No funneling or further shortening was seen on transfundal pressure.  Our concerns include Cervical insufficiency I explained the diagnosis with help of ultrasound images and diagrams.  History of preterm delivery is associated with increased risk of  recurrent preterm deliveries.  Studies have shown that weekly 17-OH progesterone injections can reduce the likelihood of preterm births. Cervical insufficiency is associated with greater risk of preterm delivery.  Given that she has a history of preterm delivery and cervical insufficiency in this pregnancy, she will benefit from cerclage procedure. I explained the procedure and possible complication of bleeding, infection, miscarriage and injuries to bladder or bowel (rare). I have recommended cerclage. Alternatively, she may consider vaginal progesterone treatment (inferior option). After counseling, patient opted to have vaginal progesterone and does not want cerclage procedure. I discussed with her provider, Dr. Derrel Nip, Family Medicine who will be prescribing this medication.   Echogenic intracardiac focus:  I counseled the patient that echogenic focus is seen in about 3% to 4% of normal fetuses, and in about 15%-20% of fetuses with Down syndrome. She was reassured that echogenic focus is not associated with any structural heart malformations. Presence of this isolated marker only slightly increases the a priori risk for Down syndrome.  I discussed the following options: 1) Maternal blood for cell-free fetal DNA screening of trisomies 21, 18 and 13, which has a greater detection rate than conventional screening tests. I informed the patient that not all chromosomal malformations are detected by this test. 2) I informed her that only amniocentesis will give a definitive result on the fetal karyotype. Amniocentesis is associated with a procedure-related pregnancy loss (1 in 500 procedures).  Patient opted not to have cell free fetal DNA screening or amniocentesis.   Recommendations -Vaginal progesterone (Prometrium) 200 mg daily at night from now till [redacted] weeks gestation. -An appointment was made for her to return in 4  weeks for follow-up growth assessment and transvaginal evaluation of  cervical length. -[facility]  Thank you for consultation.  If you have any questions or concerns, please contact me the [facility].  Consultation including face-to-face (more than 50%) counseling 45 minutes.

## 2021-05-02 NOTE — Telephone Encounter (Signed)
BP taken on 10/19 at [facility]. BP was 111/61. Sunday Spillers, CMA

## 2021-05-06 ENCOUNTER — Ambulatory Visit: Payer: Medicaid Other

## 2021-05-07 ENCOUNTER — Other Ambulatory Visit: Payer: Self-pay

## 2021-05-07 ENCOUNTER — Encounter (HOSPITAL_COMMUNITY): Payer: Self-pay | Admitting: Obstetrics & Gynecology

## 2021-05-07 ENCOUNTER — Inpatient Hospital Stay (HOSPITAL_COMMUNITY)
Admission: AD | Admit: 2021-05-07 | Discharge: 2021-05-07 | Disposition: A | Discharge status: Home / Self Care | Payer: Medicaid Other | Attending: Obstetrics & Gynecology | Admitting: Obstetrics & Gynecology

## 2021-05-07 DIAGNOSIS — O26899 Other specified pregnancy related conditions, unspecified trimester: Secondary | ICD-10-CM

## 2021-05-07 DIAGNOSIS — Z3A24 24 weeks gestation of pregnancy: Secondary | ICD-10-CM

## 2021-05-07 DIAGNOSIS — Z87891 Personal history of nicotine dependence: Secondary | ICD-10-CM | POA: Insufficient documentation

## 2021-05-07 DIAGNOSIS — O26892 Other specified pregnancy related conditions, second trimester: Secondary | ICD-10-CM | POA: Insufficient documentation

## 2021-05-07 DIAGNOSIS — F439 Reaction to severe stress, unspecified: Secondary | ICD-10-CM

## 2021-05-07 DIAGNOSIS — R109 Unspecified abdominal pain: Secondary | ICD-10-CM | POA: Diagnosis not present

## 2021-05-07 DIAGNOSIS — R1084 Generalized abdominal pain: Secondary | ICD-10-CM | POA: Diagnosis not present

## 2021-05-07 DIAGNOSIS — Z733 Stress, not elsewhere classified: Secondary | ICD-10-CM

## 2021-05-07 LAB — URINALYSIS, ROUTINE W REFLEX MICROSCOPIC
Bilirubin Urine: NEGATIVE
Glucose, UA: NEGATIVE mg/dL
Hgb urine dipstick: NEGATIVE
Ketones, ur: 80 mg/dL — AB
Nitrite: NEGATIVE
Protein, ur: NEGATIVE mg/dL
Specific Gravity, Urine: 1.016 (ref 1.005–1.030)
pH: 7 (ref 5.0–8.0)

## 2021-05-07 LAB — FETAL FIBRONECTIN: Fetal Fibronectin: NEGATIVE

## 2021-05-07 MED ORDER — LACTATED RINGERS IV BOLUS
1000.0000 mL | Freq: Once | INTRAVENOUS | Status: AC
  Administered 2021-05-07: 1000 mL via INTRAVENOUS

## 2021-05-07 MED ORDER — NIFEDIPINE 10 MG PO CAPS
10.0000 mg | ORAL_CAPSULE | ORAL | Status: AC | PRN
  Administered 2021-05-07 (×3): 10 mg via ORAL
  Filled 2021-05-07 (×3): qty 1
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
## 2021-05-07 NOTE — MAU Note (Signed)
Called Weekend Clinical Social Worker for patient and left a Engineer, technical sales.

## 2021-05-07 NOTE — MAU Note (Signed)
Called and spoke with Clinical Systems developer for [employer] was informed that she could not come into see patients. But is able to speak to pt over the phone.

## 2021-05-09 LAB — CULTURE, OB URINE

## 2021-05-13 ENCOUNTER — Telehealth: Payer: Self-pay

## 2021-05-13 NOTE — Telephone Encounter (Signed)
Patient calls nurse line regarding vomiting blood this morning. Patient reports that she had multiple episodes with "a good amount of bloody emesis." Reports increased nausea over the last week. Reports good FM. Denies loss of fluids, vaginal bleeding or abdominal pain. States she is having burning in her chest.   Advised patient to be evaluated in MAU.   Patient verbalizes understanding.   Veronda Prude, RN

## 2021-05-18 ENCOUNTER — Other Ambulatory Visit: Payer: Self-pay

## 2021-05-18 ENCOUNTER — Encounter: Payer: Self-pay | Admitting: Obstetrics & Gynecology

## 2021-05-18 ENCOUNTER — Ambulatory Visit (INDEPENDENT_AMBULATORY_CARE_PROVIDER_SITE_OTHER): Payer: Medicaid Other | Admitting: Obstetrics & Gynecology

## 2021-05-18 VITALS — BP 110/70 | HR 83 | Ht 65.0 in | Wt 147.0 lb

## 2021-05-18 DIAGNOSIS — Z3480 Encounter for supervision of other normal pregnancy, unspecified trimester: Secondary | ICD-10-CM | POA: Diagnosis not present

## 2021-05-18 DIAGNOSIS — Z3A25 25 weeks gestation of pregnancy: Secondary | ICD-10-CM

## 2021-05-18 DIAGNOSIS — O09899 Supervision of other high risk pregnancies, unspecified trimester: Secondary | ICD-10-CM | POA: Diagnosis not present

## 2021-05-18 DIAGNOSIS — O26873 Cervical shortening, third trimester: Secondary | ICD-10-CM | POA: Diagnosis not present

## 2021-05-18 DIAGNOSIS — O0992 Supervision of high risk pregnancy, unspecified, second trimester: Secondary | ICD-10-CM | POA: Diagnosis not present

## 2021-05-18 DIAGNOSIS — O26872 Cervical shortening, second trimester: Secondary | ICD-10-CM | POA: Diagnosis not present

## 2021-05-18 MED ORDER — PROGESTERONE 200 MG VA SUPP: 200.0000 mg | Freq: Every day | VAGINAL | 0 refills | Status: DC

## 2021-05-18 MED ORDER — GOJJI WEIGHT SCALE MISC
1.0000 | 0 refills | Status: DC
Start: 2021-05-18 — End: 2021-07-07

## 2021-05-18 MED ORDER — BLOOD PRESSURE KIT DEVI
1.0000 | 0 refills | Status: DC
Start: 2021-05-18 — End: 2021-07-07

## 2021-05-18 NOTE — Progress Notes (Signed)
  Subjective:Xfer OB, declined FLU vaccine and Genetic screening.    Helen Strickland is a G9E0100 [redacted]w[redacted]d being seen today for her first obstetrical visit.  Her obstetrical history is significant for  history of PTB and short cervix . Patient does intend to breast feed. Pregnancy history fully reviewed.  Patient reports occasional contractions.  Vitals:   05/18/21 1408  BP: 110/70  Pulse: 83  Weight: 147 lb (66.7 kg)    HISTORY: OB History  Gravida Para Term Preterm AB Living  2 1   1   1   SAB IAB Ectopic Multiple Live Births        0 1    # Outcome Date GA Lbr Len/2nd Weight Sex Delivery Anes PTL Lv  2 Current           1 Preterm 02/17/15 [redacted]w[redacted]d 10:39 / 00:17 5 lb 8.4 oz (2.505 kg) M Vag-Spont EPI  LIV   Past Medical History:  Diagnosis Date   Asthma    exercise induced asthma; "been years since used inhaler"   Former smoker    History of marijuana use    not while pregnant-2016    Past Surgical History:  Procedure Laterality Date   NO PAST SURGERIES     Family History  Problem Relation Age of Onset   Anxiety disorder Mother    Depression Mother    Post-traumatic stress disorder Father    Anxiety disorder Sister    Depression Sister    Alcohol abuse Neg Hx      Exam    Uterus:   25 cm FH  Pelvic Exam: deferred   Perineum:    Vulva:    Vagina:     pH:    Cervix:    Adnexa:    Bony Pelvis:   System: Breast:     Skin: normal coloration and turgor, no rashes    Neurologic: oriented, normal mood   Extremities: normal strength, tone, and muscle mass   HEENT PERRLA   Mouth/Teeth    Neck    Cardiovascular: regular rate and rhythm   Respiratory:  appears well, vitals normal, no respiratory distress, acyanotic, normal RR   Abdomen: gravid   Urinary:       Assessment:    Pregnancy: G2P0101 Patient Active Problem List   Diagnosis Date Noted   History of preterm delivery, currently pregnant 02/09/2021   Scoliosis 02/06/2019   Lumbar pain 01/31/2019    Abdominal cramping 09/20/2016   Short cervical length during pregnancy 12/27/2014   Supervision of high risk pregnancy in second trimester 08/12/2014        Plan:     Initial labs reviewed Prenatal vitamins. Problem list reviewed and updated. Genetic Screening discussed : declined.  Ultrasound discussed; fetal survey: results reviewed.  Follow up in 2 weeks. Needs 2 hr GTT 50% of 30 min visit spent on counseling and coordination of care.  Prometrium 200 mg PV hs. PTL precautions, no work restriction indicated.   10/11/2014 05/18/2021

## 2021-05-18 NOTE — Progress Notes (Signed)
Xfer OB, declined FLU vaccine and Genetic screening.

## 2021-05-20 ENCOUNTER — Other Ambulatory Visit: Payer: Self-pay

## 2021-05-20 DIAGNOSIS — J45990 Exercise induced bronchospasm: Secondary | ICD-10-CM

## 2021-05-20 DIAGNOSIS — O26873 Cervical shortening, third trimester: Secondary | ICD-10-CM

## 2021-05-20 MED ORDER — PROGESTERONE 200 MG PO CAPS: ORAL_CAPSULE | ORAL | 3 refills | Status: DC

## 2021-05-20 NOTE — Progress Notes (Signed)
Original prescription not covered by patient's insurance. Changed to Rx that was covered.

## 2021-05-25 ENCOUNTER — Ambulatory Visit: Payer: Medicaid Other

## 2021-06-06 ENCOUNTER — Institutional Professional Consult (permissible substitution): Payer: Medicaid Other | Admitting: Licensed Clinical Social Worker

## 2021-06-06 ENCOUNTER — Encounter: Payer: Medicaid Other | Admitting: Obstetrics and Gynecology

## 2021-06-06 ENCOUNTER — Other Ambulatory Visit: Payer: Medicaid Other

## 2021-06-06 NOTE — Progress Notes (Deleted)
NO show for ROB. Unable to reach patient for virtual appointment

## 2021-06-07 ENCOUNTER — Ambulatory Visit: Payer: Medicaid Other

## 2021-06-09 ENCOUNTER — Ambulatory Visit: Payer: Medicaid Other | Attending: Obstetrics | Admitting: Obstetrics

## 2021-06-09 ENCOUNTER — Encounter: Payer: Self-pay | Admitting: *Deleted

## 2021-06-09 ENCOUNTER — Other Ambulatory Visit: Payer: Self-pay

## 2021-06-09 ENCOUNTER — Ambulatory Visit (HOSPITAL_BASED_OUTPATIENT_CLINIC_OR_DEPARTMENT_OTHER): Payer: Medicaid Other

## 2021-06-09 ENCOUNTER — Ambulatory Visit: Payer: Medicaid Other | Attending: Obstetrics and Gynecology | Admitting: *Deleted

## 2021-06-09 ENCOUNTER — Other Ambulatory Visit: Payer: Self-pay | Admitting: *Deleted

## 2021-06-09 ENCOUNTER — Ambulatory Visit: Payer: Medicaid Other

## 2021-06-09 VITALS — BP 118/58 | HR 84

## 2021-06-09 DIAGNOSIS — Z3A33 33 weeks gestation of pregnancy: Secondary | ICD-10-CM

## 2021-06-09 DIAGNOSIS — Z3A28 28 weeks gestation of pregnancy: Secondary | ICD-10-CM

## 2021-06-09 DIAGNOSIS — O09213 Supervision of pregnancy with history of pre-term labor, third trimester: Secondary | ICD-10-CM | POA: Insufficient documentation

## 2021-06-09 DIAGNOSIS — O09899 Supervision of other high risk pregnancies, unspecified trimester: Secondary | ICD-10-CM

## 2021-06-09 DIAGNOSIS — O283 Abnormal ultrasonic finding on antenatal screening of mother: Secondary | ICD-10-CM

## 2021-06-09 DIAGNOSIS — Z363 Encounter for antenatal screening for malformations: Secondary | ICD-10-CM | POA: Diagnosis not present

## 2021-06-09 DIAGNOSIS — O09293 Supervision of pregnancy with other poor reproductive or obstetric history, third trimester: Secondary | ICD-10-CM

## 2021-06-09 DIAGNOSIS — O09893 Supervision of other high risk pregnancies, third trimester: Secondary | ICD-10-CM | POA: Diagnosis not present

## 2021-06-09 DIAGNOSIS — O3433 Maternal care for cervical incompetence, third trimester: Secondary | ICD-10-CM | POA: Diagnosis not present

## 2021-06-09 DIAGNOSIS — Z362 Encounter for other antenatal screening follow-up: Secondary | ICD-10-CM

## 2021-06-09 DIAGNOSIS — O26873 Cervical shortening, third trimester: Secondary | ICD-10-CM

## 2021-06-09 MED ORDER — BETAMETHASONE SOD PHOS & ACET 6 (3-3) MG/ML IJ SUSP
12.0000 mg | Freq: Once | INTRAMUSCULAR | Status: AC
  Administered 2021-06-09: 12 mg via INTRAMUSCULAR

## 2021-06-09 NOTE — Progress Notes (Signed)
MFM Note  Helen Strickland was seen for a follow up growth scan due to a shortened cervix that was noted on her prior ultrasound exam.  She has a history of a prior preterm birth at 35 weeks.  She was prescribed daily vaginal progesterone due to the sonographically detected shortened cervix.    However, the patient reports that she has not been using progesterone as she never picked up the prescription.  She reports feeling occasional contractions.  She was informed that the fetal growth and amniotic fluid level appears appropriate for her gestational age.  A transvaginal ultrasound performed today shows a dynamic cervix.  Her cervical length varied from 2.9 cm long to about 1.09 cm with funneling.  The increased risk of a preterm birth due to her shortened cervix and history of a prior preterm birth was discussed.  Due to her significantly increased risk of another preterm birth, she was given the first dose of betamethasone (antenatal corticosteroid).  She will return to our office tomorrow afternoon to receive the second dose.    A course of antenatal corticosteroids was administered today as there is a possibility that she may present to the hospital with spontaneous preterm labor and there may not be enough time for her to complete a steroid course should she deliver precipitously.  Preterm labor precautions were reviewed today.    A follow-up growth scan was scheduled in 4 weeks.    The patient stated that all of her questions have been answered.  A total of 20 minutes was spent counseling and coordinating the care for this patient.  Greater than 50% of the time was spent in direct face-to-face contact.

## 2021-06-10 ENCOUNTER — Ambulatory Visit: Payer: Medicaid Other | Attending: Obstetrics | Admitting: *Deleted

## 2021-06-10 DIAGNOSIS — Z3A28 28 weeks gestation of pregnancy: Secondary | ICD-10-CM | POA: Insufficient documentation

## 2021-06-10 DIAGNOSIS — O26873 Cervical shortening, third trimester: Secondary | ICD-10-CM | POA: Insufficient documentation

## 2021-06-10 DIAGNOSIS — O09293 Supervision of pregnancy with other poor reproductive or obstetric history, third trimester: Secondary | ICD-10-CM | POA: Insufficient documentation

## 2021-06-10 MED ORDER — BETAMETHASONE SOD PHOS & ACET 6 (3-3) MG/ML IJ SUSP
12.0000 mg | Freq: Once | INTRAMUSCULAR | Status: AC
  Administered 2021-06-10: 12 mg via INTRAMUSCULAR

## 2021-06-17 ENCOUNTER — Other Ambulatory Visit: Payer: Medicaid Other

## 2021-06-17 ENCOUNTER — Encounter: Payer: Medicaid Other | Admitting: Obstetrics

## 2021-07-05 DIAGNOSIS — O0992 Supervision of high risk pregnancy, unspecified, second trimester: Secondary | ICD-10-CM | POA: Diagnosis not present

## 2021-07-07 ENCOUNTER — Other Ambulatory Visit: Payer: Self-pay

## 2021-07-07 ENCOUNTER — Ambulatory Visit: Payer: Medicaid Other | Admitting: *Deleted

## 2021-07-07 ENCOUNTER — Ambulatory Visit: Payer: Medicaid Other | Attending: Obstetrics

## 2021-07-07 VITALS — BP 138/66 | HR 95

## 2021-07-07 DIAGNOSIS — O283 Abnormal ultrasonic finding on antenatal screening of mother: Secondary | ICD-10-CM | POA: Diagnosis not present

## 2021-07-07 DIAGNOSIS — O09893 Supervision of other high risk pregnancies, third trimester: Secondary | ICD-10-CM | POA: Insufficient documentation

## 2021-07-07 DIAGNOSIS — O403XX0 Polyhydramnios, third trimester, not applicable or unspecified: Secondary | ICD-10-CM | POA: Diagnosis not present

## 2021-07-07 DIAGNOSIS — O35EXX0 Maternal care for other (suspected) fetal abnormality and damage, fetal genitourinary anomalies, not applicable or unspecified: Secondary | ICD-10-CM

## 2021-07-07 DIAGNOSIS — O09213 Supervision of pregnancy with history of pre-term labor, third trimester: Secondary | ICD-10-CM

## 2021-07-07 DIAGNOSIS — Z3A32 32 weeks gestation of pregnancy: Secondary | ICD-10-CM | POA: Diagnosis not present

## 2021-07-08 ENCOUNTER — Other Ambulatory Visit: Payer: Self-pay | Admitting: *Deleted

## 2021-07-08 DIAGNOSIS — O09299 Supervision of pregnancy with other poor reproductive or obstetric history, unspecified trimester: Secondary | ICD-10-CM

## 2021-07-08 DIAGNOSIS — O409XX0 Polyhydramnios, unspecified trimester, not applicable or unspecified: Secondary | ICD-10-CM

## 2021-07-08 DIAGNOSIS — O09899 Supervision of other high risk pregnancies, unspecified trimester: Secondary | ICD-10-CM

## 2021-07-10 NOTE — L&D Delivery Note (Addendum)
°  Delivery Note Called to room and patient was complete, BBOW coming from MAU. Had SROM w/clear fluid.  Shortly after, head delivered 0116. No nuchal cord present. Shoulder and body delivered in usual fashion. At 0116 a viable female was delivered via Vaginal, Spontaneous (Presentation: ROA).  Infant with spontaneous cry, placed on mother's abdomen, dried and stimulated. Cord clamped x 2 after 1-minute delay, and cut by patient's brother. Cord blood drawn. Placenta delivered spontaneously with gentle cord traction. Appears intact. Fundus firm with massage and Pitocin. Labia, perineum, vagina, and cervix inspected with 4x4 gauze, appears intact.    APGAR: , ; weight: pending, see delivery summary.   Cord: 3VC with the following complications:None.   Anesthesia: None Episiotomy: None Lacerations: None Est. Blood Loss (mL): 125  Mom to postpartum.  Baby girl to Couplet care / Skin to Skin.  [place], [place] 08/16/2021 1:55 AM     The above was performed under my direct supervision and guidance.

## 2021-08-08 ENCOUNTER — Ambulatory Visit: Payer: Medicaid Other | Attending: Obstetrics and Gynecology

## 2021-08-08 ENCOUNTER — Ambulatory Visit: Payer: Medicaid Other

## 2021-08-15 ENCOUNTER — Other Ambulatory Visit (HOSPITAL_COMMUNITY)
Admission: RE | Admit: 2021-08-15 | Discharge: 2021-08-15 | Disposition: A | Discharge status: Home / Self Care | Payer: Medicaid Other | Source: Ambulatory Visit | Attending: Advanced Practice Midwife | Admitting: Advanced Practice Midwife

## 2021-08-15 ENCOUNTER — Ambulatory Visit (INDEPENDENT_AMBULATORY_CARE_PROVIDER_SITE_OTHER): Payer: Medicaid Other | Admitting: Advanced Practice Midwife

## 2021-08-15 ENCOUNTER — Other Ambulatory Visit: Payer: Self-pay

## 2021-08-15 VITALS — BP 123/72 | HR 103 | Wt 163.2 lb

## 2021-08-15 DIAGNOSIS — O0933 Supervision of pregnancy with insufficient antenatal care, third trimester: Secondary | ICD-10-CM

## 2021-08-15 DIAGNOSIS — Z3A38 38 weeks gestation of pregnancy: Secondary | ICD-10-CM | POA: Diagnosis not present

## 2021-08-15 DIAGNOSIS — O09893 Supervision of other high risk pregnancies, third trimester: Secondary | ICD-10-CM | POA: Diagnosis not present

## 2021-08-15 DIAGNOSIS — O0992 Supervision of high risk pregnancy, unspecified, second trimester: Secondary | ICD-10-CM | POA: Diagnosis not present

## 2021-08-15 DIAGNOSIS — Z23 Encounter for immunization: Secondary | ICD-10-CM | POA: Diagnosis not present

## 2021-08-15 DIAGNOSIS — O0993 Supervision of high risk pregnancy, unspecified, third trimester: Secondary | ICD-10-CM | POA: Diagnosis not present

## 2021-08-15 DIAGNOSIS — O09899 Supervision of other high risk pregnancies, unspecified trimester: Secondary | ICD-10-CM

## 2021-08-15 NOTE — Progress Notes (Signed)
Patient presents for ROB and GBS. Patient has not been seen since November. Patient states that she has been working which is why she has not shown up for her appts. Patient has no concerns today.  1 hour glucose completed today.

## 2021-08-15 NOTE — Progress Notes (Signed)
° ° °  PRENATAL VISIT NOTE  Subjective:  Helen Strickland is a 26 y.o. G2P0101 at [redacted]w[redacted]d being seen today for ongoing prenatal care.  She is currently monitored for the following issues for this high-risk pregnancy and has Supervision of high risk pregnancy in second trimester; Short cervical length during pregnancy; Abdominal cramping; Lumbar pain; Scoliosis; and History of preterm delivery, currently pregnant on their problem list.  Patient reports occasional contractions.  Contractions: Irregular. Vag. Bleeding: None.  Movement: Present. Denies leaking of fluid.   The following portions of the patient's history were reviewed and updated as appropriate: allergies, current medications, past family history, past medical history, past social history, past surgical history and problem list.   Objective:   Vitals:   08/15/21 1406  BP: 123/72  Pulse: (!) 103  Weight: 163 lb 3.2 oz (74 kg)    Fetal Status: Fetal Heart Rate (bpm): 135 Fundal Height: 38 cm Movement: Present  Presentation: Vertex  General:  Alert, oriented and cooperative. Patient is in no acute distress.  Skin: Skin is warm and dry. No rash noted.   Cardiovascular: Normal heart rate noted  Respiratory: Normal respiratory effort, no problems with respiration noted  Abdomen: Soft, gravid, appropriate for gestational age.  Pain/Pressure: Absent     Pelvic: Cervical exam performed in the presence of a chaperone Dilation: 4.5 Effacement (%): 70 Station: -1  Extremities: Normal range of motion.  Edema: None  Mental Status: Normal mood and affect. Normal behavior. Normal judgment and thought content.   Assessment and Plan:  Pregnancy: G2P0101 at [redacted]w[redacted]d 1. Supervision of high risk pregnancy in second trimester --Anticipatory guidance about next visits/weeks of pregnancy given.  --Next appt in 1 week --Pt expressed interest in waterbirth.  Given limited prenatal care, with single visit at [facility] in November and then today's visit, she is  not a good candidate for waterbirth.  Discussed with pt today who is disappointed but states understanding.  Discussed options for hydrotherapy with shower, use of birth ball, freedom of movement and support of physiologic childbirth if desired.    - Strep Gp B NAA - Cervicovaginal ancillary only( [place]) - Tdap vaccine greater than or equal to 7yo IM - CBC - HIV Antibody (routine testing w rflx) - RPR  2. Limited prenatal care in third trimester --Pt last appt in office for NOB on 05/18/21, then MFM visit 06/09/21 with BMZ given x 2 (12/1 and 12/2) for shortened cervix. --Pt reports she has difficulty with appt due to her job --Will complete labs today and pt able to stay for 1 hour glucose  - Glucose, 1 hour gestational  3. History of preterm delivery, currently pregnant   Term labor symptoms and general obstetric precautions including but not limited to vaginal bleeding, contractions, leaking of fluid and fetal movement were reviewed in detail with the patient. Please refer to After Visit Summary for other counseling recommendations.   No follow-ups on file.  Future Appointments  Date Time Provider Department Center  08/23/2021  8:15 AM Leftwich-Kirby, Wilmer Floor, CNM [facility] None    Sharen Counter, CNM

## 2021-08-16 ENCOUNTER — Inpatient Hospital Stay (HOSPITAL_COMMUNITY)
Admission: AD | Admit: 2021-08-16 | Discharge: 2021-08-18 | DRG: 807 | Disposition: A | Discharge status: Home / Self Care | Payer: Medicaid Other | Attending: Obstetrics & Gynecology | Admitting: Obstetrics & Gynecology

## 2021-08-16 ENCOUNTER — Encounter (HOSPITAL_COMMUNITY): Payer: Self-pay | Admitting: Obstetrics & Gynecology

## 2021-08-16 DIAGNOSIS — O4202 Full-term premature rupture of membranes, onset of labor within 24 hours of rupture: Secondary | ICD-10-CM | POA: Diagnosis not present

## 2021-08-16 DIAGNOSIS — Z20822 Contact with and (suspected) exposure to covid-19: Secondary | ICD-10-CM | POA: Diagnosis present

## 2021-08-16 DIAGNOSIS — O0992 Supervision of high risk pregnancy, unspecified, second trimester: Secondary | ICD-10-CM

## 2021-08-16 DIAGNOSIS — A5901 Trichomonal vulvovaginitis: Secondary | ICD-10-CM | POA: Diagnosis not present

## 2021-08-16 DIAGNOSIS — O9832 Other infections with a predominantly sexual mode of transmission complicating childbirth: Principal | ICD-10-CM | POA: Diagnosis present

## 2021-08-16 DIAGNOSIS — O26893 Other specified pregnancy related conditions, third trimester: Secondary | ICD-10-CM | POA: Diagnosis not present

## 2021-08-16 DIAGNOSIS — Z3480 Encounter for supervision of other normal pregnancy, unspecified trimester: Secondary | ICD-10-CM | POA: Insufficient documentation

## 2021-08-16 DIAGNOSIS — Z87891 Personal history of nicotine dependence: Secondary | ICD-10-CM

## 2021-08-16 DIAGNOSIS — Z3A38 38 weeks gestation of pregnancy: Secondary | ICD-10-CM | POA: Diagnosis not present

## 2021-08-16 DIAGNOSIS — O09899 Supervision of other high risk pregnancies, unspecified trimester: Secondary | ICD-10-CM

## 2021-08-16 LAB — CBC
HCT: 33.2 % — ABNORMAL LOW (ref 36.0–46.0)
Hematocrit: 35.5 % (ref 34.0–46.6)
Hemoglobin: 11.7 g/dL (ref 11.1–15.9)
Hemoglobin: 11.2 g/dL — ABNORMAL LOW (ref 12.0–15.0)
MCH: 29.1 pg (ref 26.6–33.0)
MCH: 30.3 pg (ref 26.0–34.0)
MCHC: 33 g/dL (ref 31.5–35.7)
MCHC: 33.7 g/dL (ref 30.0–36.0)
MCV: 88 fL (ref 79–97)
MCV: 89.7 fL (ref 80.0–100.0)
Platelets: 280 10*3/uL (ref 150–450)
Platelets: 283 10*3/uL (ref 150–400)
RBC: 4.02 x10E6/uL (ref 3.77–5.28)
RBC: 3.7 MIL/uL — ABNORMAL LOW (ref 3.87–5.11)
RDW: 15.1 % (ref 11.7–15.4)
RDW: 16.1 % — ABNORMAL HIGH (ref 11.5–15.5)
WBC: 6.9 10*3/uL (ref 3.4–10.8)
WBC: 10.5 10*3/uL (ref 4.0–10.5)
nRBC: 0 % (ref 0.0–0.2)

## 2021-08-16 LAB — TYPE AND SCREEN
ABO/RH(D): B POS
Antibody Screen: NEGATIVE

## 2021-08-16 LAB — RESP PANEL BY RT-PCR (FLU A&B, COVID) ARPGX2
Influenza A by PCR: NEGATIVE
Influenza B by PCR: NEGATIVE
SARS Coronavirus 2 by RT PCR: NEGATIVE

## 2021-08-16 LAB — HIV ANTIBODY (ROUTINE TESTING W REFLEX): HIV Screen 4th Generation wRfx: NONREACTIVE

## 2021-08-16 LAB — RPR
RPR Ser Ql: NONREACTIVE
RPR Ser Ql: NONREACTIVE

## 2021-08-16 LAB — GLUCOSE, 1 HOUR GESTATIONAL: Gestational Diabetes Screen: 130 mg/dL (ref 70–139)

## 2021-08-16 MED ORDER — DIPHENHYDRAMINE HCL 25 MG PO CAPS: 25.0000 mg | ORAL_CAPSULE | Freq: Four times a day (QID) | ORAL | Status: DC | PRN

## 2021-08-16 MED ORDER — COCONUT OIL OIL
1.0000 "application " | TOPICAL_OIL | Status: DC | PRN
  Administered 2021-08-16: 1 via TOPICAL

## 2021-08-16 MED ORDER — OXYCODONE-ACETAMINOPHEN 5-325 MG PO TABS: 1.0000 | ORAL_TABLET | ORAL | Status: DC | PRN

## 2021-08-16 MED ORDER — OXYCODONE-ACETAMINOPHEN 5-325 MG PO TABS: 2.0000 | ORAL_TABLET | ORAL | Status: DC | PRN

## 2021-08-16 MED ORDER — PRENATAL MULTIVITAMIN CH
1.0000 | ORAL_TABLET | Freq: Every day | ORAL | Status: DC
  Administered 2021-08-16 – 2021-08-17 (×2): 1 via ORAL
  Filled 2021-08-16 (×2): qty 1

## 2021-08-16 MED ORDER — IBUPROFEN 600 MG PO TABS
600.0000 mg | ORAL_TABLET | Freq: Four times a day (QID) | ORAL | Status: DC
  Administered 2021-08-16 – 2021-08-18 (×9): 600 mg via ORAL
  Filled 2021-08-16 (×9): qty 1

## 2021-08-16 MED ORDER — SENNOSIDES-DOCUSATE SODIUM 8.6-50 MG PO TABS
2.0000 | ORAL_TABLET | Freq: Every day | ORAL | Status: DC
  Administered 2021-08-17: 2 via ORAL
  Filled 2021-08-16: qty 2

## 2021-08-16 MED ORDER — SIMETHICONE 80 MG PO CHEW: 80.0000 mg | CHEWABLE_TABLET | ORAL | Status: DC | PRN

## 2021-08-16 MED ORDER — ACETAMINOPHEN 325 MG PO TABS
650.0000 mg | ORAL_TABLET | ORAL | Status: DC | PRN
  Administered 2021-08-16: 650 mg via ORAL
  Filled 2021-08-16: qty 2

## 2021-08-16 MED ORDER — FENTANYL CITRATE (PF) 100 MCG/2ML IJ SOLN
100.0000 ug | Freq: Once | INTRAMUSCULAR | Status: AC
  Administered 2021-08-16: 100 ug via INTRAVENOUS

## 2021-08-16 MED ORDER — ACETAMINOPHEN 325 MG PO TABS: 650.0000 mg | ORAL_TABLET | ORAL | Status: DC | PRN

## 2021-08-16 MED ORDER — LACTATED RINGERS IV SOLN
INTRAVENOUS | Status: DC
Start: 2021-08-16 — End: 2021-08-16

## 2021-08-16 MED ORDER — ZOLPIDEM TARTRATE 5 MG PO TABS: 5.0000 mg | ORAL_TABLET | Freq: Every evening | ORAL | Status: DC | PRN

## 2021-08-16 MED ORDER — ONDANSETRON HCL 4 MG/2ML IJ SOLN: 4.0000 mg | Freq: Four times a day (QID) | INTRAMUSCULAR | Status: DC | PRN

## 2021-08-16 MED ORDER — DIBUCAINE (PERIANAL) 1 % EX OINT
1.0000 "application " | TOPICAL_OINTMENT | CUTANEOUS | Status: DC | PRN
  Administered 2021-08-16: 1 via RECTAL
  Filled 2021-08-16: qty 28

## 2021-08-16 MED ORDER — LACTATED RINGERS IV SOLN: 500.0000 mL | INTRAVENOUS | Status: DC | PRN

## 2021-08-16 MED ORDER — ONDANSETRON HCL 4 MG PO TABS: 4.0000 mg | ORAL_TABLET | ORAL | Status: DC | PRN

## 2021-08-16 MED ORDER — OXYTOCIN BOLUS FROM INFUSION
333.0000 mL | Freq: Once | INTRAVENOUS | Status: AC
  Administered 2021-08-16: 333 mL via INTRAVENOUS

## 2021-08-16 MED ORDER — LIDOCAINE HCL (PF) 1 % IJ SOLN: 30.0000 mL | INTRAMUSCULAR | Status: DC | PRN

## 2021-08-16 MED ORDER — FENTANYL CITRATE (PF) 100 MCG/2ML IJ SOLN
INTRAMUSCULAR | Status: AC
  Filled 2021-08-16: qty 2

## 2021-08-16 MED ORDER — SOD CITRATE-CITRIC ACID 500-334 MG/5ML PO SOLN: 30.0000 mL | ORAL | Status: DC | PRN

## 2021-08-16 MED ORDER — ONDANSETRON HCL 4 MG/2ML IJ SOLN: 4.0000 mg | INTRAMUSCULAR | Status: DC | PRN

## 2021-08-16 MED ORDER — WITCH HAZEL-GLYCERIN EX PADS
1.0000 "application " | MEDICATED_PAD | CUTANEOUS | Status: DC | PRN
  Administered 2021-08-16: 1 via TOPICAL

## 2021-08-16 MED ORDER — TETANUS-DIPHTH-ACELL PERTUSSIS 5-2.5-18.5 LF-MCG/0.5 IM SUSY: 0.5000 mL | PREFILLED_SYRINGE | Freq: Once | INTRAMUSCULAR | Status: DC

## 2021-08-16 MED ORDER — OXYTOCIN-SODIUM CHLORIDE 30-0.9 UT/500ML-% IV SOLN
2.5000 [IU]/h | INTRAVENOUS | Status: DC
  Filled 2021-08-16: qty 500

## 2021-08-16 MED ORDER — KETOROLAC TROMETHAMINE 30 MG/ML IJ SOLN
30.0000 mg | Freq: Once | INTRAMUSCULAR | Status: AC
  Administered 2021-08-16: 30 mg via INTRAVENOUS
  Filled 2021-08-16: qty 1

## 2021-08-16 MED ORDER — OXYCODONE HCL 5 MG PO TABS
5.0000 mg | ORAL_TABLET | ORAL | Status: DC | PRN
  Administered 2021-08-16: 5 mg via ORAL
  Filled 2021-08-16: qty 1

## 2021-08-16 MED ORDER — BENZOCAINE-MENTHOL 20-0.5 % EX AERO
1.0000 "application " | INHALATION_SPRAY | CUTANEOUS | Status: DC | PRN
  Administered 2021-08-16: 1 via TOPICAL
  Filled 2021-08-16: qty 56

## 2021-08-16 NOTE — Discharge Summary (Signed)
Postpartum Discharge Summary     Patient Name: Helen Strickland DOB: March 31, 1996 MRN: [id number]  Date of admission: 08/16/2021 Delivery date:08/16/2021  Delivering provider: WHITE, Ubaldo Glassing D  Date of discharge: 08/18/2021  Admitting diagnosis: Supervision of other normal pregnancy, antepartum [Z34.80] Intrauterine pregnancy: [redacted]w[redacted]d    Secondary diagnosis:  Active Problems:   History of preterm delivery, currently pregnant   Trichomonas vaginitis  Additional problems: insufficient [facility]   Discharge diagnosis: Term Pregnancy Delivered                                              Post partum procedures: Flagyl 2gm for trichomonas collected 08/15/21 Augmentation: none Complications: None  Hospital course: Onset of Labor With Vaginal Delivery      26y.o. yo G2P0101 at 377w3das admitted in Active Labor on 08/16/2021. Patient had an uncomplicated labor course as follows:  Membrane Rupture Time/Date: 1:12 AM ,08/16/2021   Delivery Method:Vaginal, Spontaneous  Episiotomy: None  Lacerations:  None  Patient had an uncomplicated postpartum course.  She is ambulating, tolerating a regular diet, passing flatus, and urinating well. Patient is discharged home in stable condition on 08/18/21.  Newborn Data: Birth date:08/16/2021  Birth time:1:16 AM  Gender:Female  Living status:Living  Apgars:9 ,9  Weight:2820 g   Magnesium Sulfate received: No BMZ received: No Rhophylac:N/A MMR:N/A T-DaP: no Flu: No Transfusion:No  Physical exam  Vitals:   08/17/21 0539 08/17/21 1530 08/17/21 2116 08/18/21 0554  BP: 110/64 118/74 117/65 118/63  Pulse: 85 80 89 65  Resp: _0 Temp: 98.5 F (36.9 C)  98.4 F (36.9 C) 98.2 F (36.8 C)  TempSrc: Oral  Oral Oral  SpO2: 99%     Weight:      Height:       General: alert and cooperative Lochia: appropriate Uterine Fundus: firm Incision: N/A DVT Evaluation: No evidence of DVT seen on physical exam. Labs: Lab Results  Component Value Date    WBC 10.5 08/16/2021   HGB 11.2 (L) 08/16/2021   HCT 33.2 (L) 08/16/2021   MCV 89.7 08/16/2021   PLT 283 08/16/2021   CMP Latest Ref Rng & Units 06/08/2018  Glucose 70 - 99 mg/dL 86  BUN 6 - 20 mg/dL 10  Creatinine 0.44 - 1.00 mg/dL 0.80  Sodium 135 - 145 mmol/L 138  Potassium 3.5 - 5.1 mmol/L 3.8  Chloride 98 - 111 mmol/L 103   Edinburgh Score: Edinburgh Postnatal Depression Scale Screening Tool 08/17/2021  I have been able to laugh and see the funny side of things. 0  I have looked forward with enjoyment to things. 0  I have blamed myself unnecessarily when things went wrong. 1  I have been anxious or worried for no good reason. 1  I have felt scared or panicky for no good reason. 0  Things have been getting on top of me. 1  I have been so unhappy that I have had difficulty sleeping. 0  I have felt sad or miserable. 0  I have been so unhappy that I have been crying. 0  The thought of harming myself has occurred to me. 0  Edinburgh Postnatal Depression Scale Total 3     After visit meds:  Allergies as of 08/18/2021   No Known Allergies      Medication List  TAKE these medications    ibuprofen 600 MG tablet Commonly known as: ADVIL Take 1 tablet (600 mg total) by mouth every 6 (six) hours as needed.         Discharge home in stable condition Infant Feeding: Breast Infant Disposition:home with mother Discharge instruction: per After Visit Summary and Postpartum booklet. Activity: Advance as tolerated. Pelvic rest for 6 weeks.  Diet: routine diet Future Appointments: Future Appointments  Date Time Provider [place]  09/13/2021  3:50 PM Patriciaann Clan, DO [place] None   Follow up Visit:   Please schedule this patient for a In person postpartum visit in 4 weeks with the following provider: Any provider. Additional Postpartum F/U:  Low risk pregnancy complicated by:  Delivery mode:  Vaginal, Spontaneous  Anticipated Birth Control:    delcines   08/18/2021 Myrtis Ser, CNM 9:31 AM

## 2021-08-16 NOTE — H&P (Signed)
OBSTETRIC ADMISSION HISTORY AND PHYSICAL  Helen Strickland is a 26 y.o. female G52P0101 with IUP at [redacted]w[redacted]d by 22 week [place] presenting for labor. She reports +FMs, No LOF, no VB, no blurry vision, headaches or peripheral edema, and RUQ pain.  She plans on breast feeding. She request nothing for birth control. She received her prenatal care at [facility]  Dating: By 22 week [place] --->  Estimated Date of Delivery: 08/27/21     Prenatal History/Complications: insufficient [facility] 2  Past Medical History: Past Medical History:  Diagnosis Date   Asthma    exercise induced asthma; "been years since used inhaler"   Former smoker    History of marijuana use    not while pregnant-2016     Past Surgical History: Past Surgical History:  Procedure Laterality Date   NO PAST SURGERIES      Obstetrical History: OB History     Gravida  2   Para  1   Term      Preterm  1   AB      Living  1      SAB      IAB      Ectopic      Multiple  0   Live Births  1           Social History Social History   Socioeconomic History   Marital status: Single    Spouse name: Not on file   Number of children: 1   Years of education: Not on file   Highest education level: High school graduate  Occupational History   Not on file  Tobacco Use   Smoking status: Former    Types: Cigarettes   Smokeless tobacco: Never  Vaping Use   Vaping Use: Former  Substance and Sexual Activity   Alcohol use: No    Comment: social   Drug use: Yes    Types: Marijuana    Comment: last weed was when she was 4 months preg   Sexual activity: Not Currently  Other Topics Concern   Not on file  Social History Narrative   Lives with mother Estephanie Hubbs, 2 younger brothers(Nyjha and Darrien) and one older sister NyTajha   Social Determinants of Health   Financial Resource Strain: Not on file  Food Insecurity: Not on file  Transportation Needs: Not on file  Physical Activity: Not on file  Stress:  Not on file  Social Connections: Not on file    Family History: Family History  Problem Relation Age of Onset   Anxiety disorder Mother    Depression Mother    Post-traumatic stress disorder Father    Anxiety disorder Sister    Depression Sister    Alcohol abuse Neg Hx     Allergies: No Known Allergies  Medications Prior to Admission  Medication Sig Dispense Refill Last Dose   prenatal vitamin w/FE, FA (PRENATAL 1 + 1) 27-1 MG TABS tablet Take 1 tablet by mouth daily at 12 noon.        Review of Systems   All systems reviewed and negative except as stated in HPI  Last menstrual period 11/26/2020. General appearance: alert, cooperative, and moderate distress Lungs: clear to auscultation bilaterally Heart: regular rate and rhythm Abdomen: soft, non-tender; bowel sounds normal Pelvic: C/C/BBOW Extremities: Homans sign is negative, no sign of DVT DTR's 2+ Presentation: cephalic Fetal monitoringBaseline: 150 bpm, Variability: Good {> 6 bpm), Accelerations: Reactive, and Decelerations: Variable: mild Uterine activityq 2 minutes  Prenatal labs: ABO, Rh: B/Positive/-- (08/03 1704) Antibody: Comment, See Final Results (10/12 1454) Rubella: 28.80 (08/03 1704) RPR: Non Reactive (08/03 1704)  HBsAg: Negative (08/03 1704)  HIV: Non Reactive (08/03 1704)   Nursing Staff Provider  Office Location  [facility] Dating   22.4 week [place]: LMP was approximate  Language  ENGLISH Anatomy US  Short cervix  Flu Vaccine  DECLINED 05/18/21 Genetic Screen  Declined  TDaP vaccine   Given 08/15/21 Hgb A1C or  GTT Early  Third trimester   Rhogam     LAB RESULTS   Feeding Plan BREASTFEED Blood Type B/Positive/-- (08/03 1704)   Contraception NONE Antibody Comment, See Final Results (10/12 1454)  Circumcision NA Rubella 28.80 (08/03 1704)  Pediatrician   UNSURE RPR Non Reactive (08/03 1704)   Support Person  FOB DEDRICK HBsAg Negative (08/03 1704)   Prenatal Classes  HIV Non Reactive (08/03  1704)  BTL Consent  GBS  (For PCN allergy, check sensitivities)   VBAC Consent  Pap   Resident PCP   Hgb Electro    COVID Vax  Declined CF   Hep C Antibody   SMA   Aspirin indicated   Waterbirth  Refer to [facility] -> [ ]  Class [ ]  Consent [ ]  CNM visit    Prenatal Transfer Tool  Maternal Diabetes: 1hr Gtt results pending Genetic Screening: Declined Maternal Ultrasounds/Referrals: Normal Fetal Ultrasounds or other Referrals:  None Maternal Substance Abuse:  No Significant Maternal Medications:  None Significant Maternal Lab Results: GBS pending  No results found for this or any previous visit (from the past 24 hour(s)).  Patient Active Problem List   Diagnosis Date Noted   History of preterm delivery, currently pregnant 02/09/2021   Scoliosis 02/06/2019   Lumbar pain 01/31/2019   Abdominal cramping 09/20/2016   Short cervical length during pregnancy 12/27/2014   Supervision of high risk pregnancy in second trimester 08/12/2014    Assessment/Plan:  Helen Strickland is a 26 y.o. G2P0101 at [redacted]w[redacted]d here foractive labor, delivery imminent  #Labor:SROM w/clear fluid #Pain: Too late #FWB: Cat 1/2 #ID:  GBS unknown #MOF: breast #MOC:none   [name]   26, MD  08/16/2021, 1:02 AM

## 2021-08-16 NOTE — MAU Note (Addendum)
PT HAS ARRIVED  SCREAMING -  FROM LOBBY -TAKEN TO RM 130 FHR- 130 VE - 10 WITH BULGING BAG TRANSFER TO RM 216- WITH MELANIE, CNM

## 2021-08-16 NOTE — Lactation Note (Signed)
This note was copied from a baby's chart. Lactation Consultation Note Mom BF baby when LC came into room. Mom stated the baby is BF well w/o any problems and no painful latches. Mom stated she BF her 1st child 2-3 months. Praised mom.  Newborn feeding habits discussed. Mom encouraged to feed baby 8-12 times/24 hours and with feeding cues.   Mom pre-occupied w/someone on the phone called and wanted to talk w/mom. Lactation brochure given. Encouraged to call for assistance or questions.  Patient Name: Helen Strickland IRCVE'L Date: 08/16/2021 Reason for consult: Initial assessment;Early term 37-38.6wks Age:33 hours  Maternal Data Does the patient have breastfeeding experience prior to this delivery?: Yes How long did the patient breastfeed?: 2-3 months  Feeding    LATCH Score Latch: Grasps breast easily, tongue down, lips flanged, rhythmical sucking.  Audible Swallowing: None  Type of Nipple: Everted at rest and after stimulation (very short shaft/compressible)  Comfort (Breast/Nipple): Soft / non-tender  Hold (Positioning): No assistance needed to correctly position infant at breast.  LATCH Score: 8   Lactation Tools Discussed/Used    Interventions Interventions: Breast feeding basics reviewed;Skin to skin  Discharge    Consult Status Consult Status: Follow-up Date: 08/17/21 Follow-up type: In-patient    Charyl Dancer 08/16/2021, 3:54 AM

## 2021-08-16 NOTE — Lactation Note (Signed)
This note was copied from a baby's chart. Lactation Consultation Note  Patient Name: Helen Strickland HVFMB'B Date: 08/16/2021 Reason for consult: Initial assessment;Early term 37-38.6wks Age:25 hours, female infant P2, Per mom, she feels breastfeeding is going well and infant has been latching at both breast during a feeding for 20 to 30 minutes. Infant recently breastfeed for 20 minutes at 1630 pm for 20 minutes, LC did not observe latch infant asleep in mom's arms. LC did not observe latch at this time. Mom knows to breastfeed infant according to hunger cues, 8 to 12+ or more times within 24 hours, skin to skin. Mom doesn't have any questions or concerns for LC at this time. Mom requested hand pump for home prn in use, LC explained how to use. Mom shown how to use hand pump & how to disassemble, clean, & reassemble parts.  Mom made aware of O/P services, breastfeeding support groups, community resources, and our phone # for post-discharge questions.   Maternal Data Does the patient have breastfeeding experience prior to this delivery?: Yes How long did the patient breastfeed?: Per mom, she BF her 76 year old son for 2 months.  Feeding Mother's Current Feeding Choice: Breast Milk  LATCH Score              Lactation Tools Discussed/Used Tools: Pump;Flanges Flange Size: 24 Breast pump type: Manual Pump Education: Setup, frequency, and cleaning;Milk Storage Reason for Pumping: Mom requested hand pump for prn home use. Pumping frequency: prn home use  Interventions Interventions: Breast feeding basics reviewed;Skin to skin;Position options;Hand pump;Education;LC Services brochure  Discharge    Consult Status Consult Status: Follow-up Date: 08/17/21 Follow-up type: In-patient    Danelle Earthly 08/16/2021, 5:18 PM

## 2021-08-17 LAB — CERVICOVAGINAL ANCILLARY ONLY
Chlamydia: NEGATIVE
Comment: NEGATIVE
Comment: NEGATIVE
Comment: NORMAL
Neisseria Gonorrhea: NEGATIVE
Trichomonas: POSITIVE — AB

## 2021-08-17 LAB — STREP GP B NAA: Strep Gp B NAA: POSITIVE — AB

## 2021-08-17 MED ORDER — METRONIDAZOLE 500 MG PO TABS
2000.0000 mg | ORAL_TABLET | Freq: Once | ORAL | Status: AC
  Administered 2021-08-17: 2000 mg via ORAL
  Filled 2021-08-17: qty 4

## 2021-08-17 NOTE — Progress Notes (Signed)
Post Partum Day 1 Subjective: no complaints, up ad lib, voiding, tolerating PO, and + flatus. Heat went off in house before she went into labor, has to check to see if it's working, if not, doesn't want to be d/c'd til tomorrow.   Objective: Blood pressure 110/64, pulse 85, temperature 98.5 F (36.9 C), temperature source Oral, resp. rate 18, height 5\' 5"  (1.651 m), weight 74 kg, last menstrual period 11/26/2020, SpO2 99 %, unknown if currently breastfeeding.  Physical Exam:  General: alert, cooperative, and no distress Lochia: appropriate Uterine Fundus: firm Incision: n/a DVT Evaluation: No evidence of DVT seen on physical exam. Negative Homan's sign. No cords or calf tenderness. No significant calf/ankle edema.  Recent Labs    08/15/21 1520 08/16/21 0107  HGB 11.7 11.2*  HCT 35.5 33.2*    Assessment/Plan: Plan for d/c tomorrow, unless heat working at home now-would like to go later today Breastfeeding Plans condoms for birth control   LOS: 1 day   10/14/21 08/17/2021, 7:40 AM

## 2021-08-17 NOTE — Progress Notes (Signed)
Patient found to be positive for trichomonas on 2/6. Not yet treated. Discussed treatment options with patient. Will give 2g dose of Flagyl with plan for test of cure at her postpartum visit. Advised to abstain for intercourse until both her and her partner are adequately treated and then 2 weeks after that. Emphasized importance of pelvic rest after delivery. Patient voiced understanding and is in agreement with plan.   Evalina Field, MD

## 2021-08-17 NOTE — Clinical Social Work Maternal (Signed)
°CLINICAL SOCIAL WORK MATERNAL/CHILD NOTE ° °Patient Details  °Name: Helen Strickland °MRN: [id number] °Date of Birth: 01/03/1996 ° °Date:  08/17/2021 ° °Clinical Social Worker Initiating Note:  Kaemon Barnett, LCSWA Date/Time: Initiated:  08/17/21/0945    ° °Child's Name:  Helen Strickland  ° °Biological Parents:  Mother, Father (Dedrick Strickland 04/06/1975)  ° °Need for Interpreter:  None  ° °Reason for Referral:  Current Substance Use/Substance Use During Pregnancy  , Late or No Prenatal Care  , Other (Comment) (Transportation Needs)  ° °Address:  [address] °[place] [address]  °  °Phone number:  [phone] (home)    ° °Additional phone number:  ° °Household Members/Support Persons (HM/SP):   Household Member/Support Person 1 ° ° °HM/SP Name Relationship DOB or Age  °HM/SP -1 Messiah Lavar Wallace Son 02/17/2015  °HM/SP -2        °HM/SP -3        °HM/SP -4        °HM/SP -5        °HM/SP -6        °HM/SP -7        °HM/SP -8        ° ° °Natural Supports (not living in the home):  Immediate Family, Parent  ° °Professional Supports: None  ° °Employment: Full-time  ° °Type of Work: [employer]  ° °Education:  Some College  ° °Homebound arranged:   ° °Financial Resources:  Medicaid  ° °Other Resources:  WIC, Food Stamps    ° °Cultural/Religious Considerations Which May Impact Care:   ° °Strengths:  Ability to meet basic needs  , Home prepared for child    ° °Psychotropic Medications:        ° °Pediatrician:     Still in process of choosing ° °Pediatrician List:  ° °[place]    °[place]    °[place]    °[place]    °[place]    °[place]    ° ° °Pediatrician Fax Number:   ° °Risk Factors/Current Problems:  Substance Use    ° °Cognitive State:  Alert  , Linear Thinking  , Insightful    ° °Mood/Affect:  Bright  , Calm  , Interested    ° °CSW Assessment: CSW consulted for limited prenatal care and transportation needs. CSW reviewed chart and noted THC use. CSW met with MOB to  offer support and assess. CSW introduced self and role. CSW observed MOB holding infant. MOB informed CSW that her brother was present in the restroom and provided permission for him to remain in room for assessment. CSW informed MOB for the reason for consult and assessed current feelings. MOB was easily engaged and pleasant. MOB stated she is tired, but doing alright. MOB lives with her 25yo son and is employed full-time. MOB receives WIC and food stamp benefits. MOB will make contact with WIC to have infant added to benefits. MOB disclosed a history of depression, stating she was diagnosed in 2019. MOB reported she experienced some depression at the beginning of the pregnancy, due to FOB initially not wanting to be involved. CSW asked MOB how she coped, MOB stated the depression passed on its own. MOB disclosed she was prescribed medication when first diagnosed with depression, which was helpful, however she discontinued the use due to not liking the side effects. MOB stated she has attended therapy in the past and she is still open to the resource. CSW discussed positive coping skills   with MOB. MOB identified her siblings and FOB as supports postpartum. MOB denies any current SI or HI. CSW did not inquire on DV due to brother being present.  ° °CSW inquired on LPNC. MOB stated she had limited prenatal care due to only being off of work on Mondays. MOB expressed she did not want to miss out on work due to appointments and denies any additional barriers. CSW inquired on MOB substance use. MOB reported she smoked marijuana daily prior to being pregnant. MOB admits to smoking during pregnancy and is unable to recall the last time she smoked and denies any additional substance use. CSW informed MOB of the hospital drug screen policy, MOB aware infant UDS is positive for THC and a CPS report will be made. MOB aware infant CDS will be followed. MOB disclosed she had a CPS case with [place] in 2017 when her son  was 9 months old. MOB stated she was holding her son when she fell down stairs. MOB reported infant did not have any broken bones but "someone at [facility] made a report." MOB stated the case was closed and she has no additional history.  ° °CSW provided education regarding the baby blues period versus PPD and provided resources. CSW provided the New Mom Checklist and encouraged MOB to self evaluate.  °CSW provided review of Sudden Infant Death Syndrome (SIDS) precautions. MOB reported she as a bassinet, car seat and all essentials. MOB is still choosing a pediatrician and reports she has no transportation needs. MOB denies any additional stressors or needs at this time.    ° °Report made with [place] CPS. CSW will continue to follow CDS and make an additional report if warranted. CSW identifies no further need for intervention and no barriers to discharge at this time. ° °CSW Plan/Description:  CSW Will Continue to Monitor Umbilical Cord Tissue Drug Screen Results and Make Report if Warranted, Child Protective Service Report  , Hospital Drug Screen Policy Information, Perinatal Mood and Anxiety Disorder (PMADs) Education, Sudden Infant Death Syndrome (SIDS) Education, Other Information/Referral to Community Resources, No Further Intervention Required/No Barriers to Discharge, Other Patient/Family Education  ° ° °Chestina Komatsu J Rahi Chandonnet, LCSWA °08/17/2021, 10:35 AM °

## 2021-08-17 NOTE — Lactation Note (Signed)
This note was copied from a baby's chart. Lactation Consultation Note  Patient Name: Girl Nautia Lem HWEXH'B Date: 08/17/2021   Age:25 hours Per RN ( TY), mom would like to be seen by [facility] services in the morning but not tonight. Maternal Data    Feeding    LATCH Score                    Lactation Tools Discussed/Used    Interventions    Discharge    Consult Status      Danelle Earthly 08/17/2021, 11:20 PM

## 2021-08-18 DIAGNOSIS — A5901 Trichomonal vulvovaginitis: Secondary | ICD-10-CM | POA: Diagnosis present

## 2021-08-18 MED ORDER — IBUPROFEN 600 MG PO TABS: 600.0000 mg | ORAL_TABLET | Freq: Four times a day (QID) | ORAL | 0 refills | Status: DC | PRN

## 2021-08-18 NOTE — Lactation Note (Signed)
This note was copied from a baby's chart. Lactation Consultation Note  Patient Name: Helen Strickland [id number] Date: 08/18/2021 Reason for consult: Follow-up assessment;Other (Comment);Breastfeeding assistance (per mom last fed at 5:43 and was cluster feeding last night. LC discussed the importance of feeding with cues and by 3 hours STS. milk is in and baby latched for 7 mins/ multiple swallows and baby released.)Latch score 8  Age:25 hours LC reviewed BF D/C teaching, mom has a hand pump for D/C.  Positional strip noted on the right nipple when assisting with latch. Per mom using coconut oil after the baby feeds.  LC recommended prior to latching to assure the baby is latching deeply consistently.  Breast massage, hand express, pre - pump, reverse pressure and latch with firm support and breast compressions until comfortable and swallows.  Mom aware of [place] resources after D/C.  Maternal Data Has patient been taught Hand Expression?: Yes  Feeding Mother's Current Feeding Choice: Breast Milk  LATCH Score Latch: Grasps breast easily, tongue down, lips flanged, rhythmical sucking.  Audible Swallowing: Spontaneous and intermittent  Type of Nipple: Everted at rest and after stimulation  Comfort (Breast/Nipple): Filling, red/small blisters or bruises, mild/mod discomfort  Hold (Positioning): Assistance needed to correctly position infant at breast and maintain latch.  LATCH Score: 8   Lactation Tools Discussed/Used - per mom will plan to contact [place] if she needs a DEBP.  Tools: Pump;Coconut oil;Flanges Flange Size: 24;21 Breast pump type: Manual Pump Education: Milk Storage  Interventions Interventions: Breast feeding basics reviewed;Skin to skin;Assisted with latch;Breast massage;Hand express;Reverse pressure;Breast compression;Adjust position;Support pillows;Position options;Coconut oil;Hand pump;Education;LC Services brochure  Discharge Discharge Education:  Engorgement and breast care;Warning signs for feeding baby Pump: Manual WIC Program: Yes  Consult Status Consult Status: Complete Date: 08/18/21    Jerlyn Ly Kaius Daino 08/18/2021, 10:14 AM

## 2021-08-19 ENCOUNTER — Telehealth: Payer: Self-pay

## 2021-08-19 NOTE — Telephone Encounter (Signed)
Transition Care Management Follow-up Telephone Call Date of discharge and from where: 08/18/2021 from [facility] How have you been since you were released from the hospital? Patient stated that she is feeling well and did not have any questions or concerns at this time.  Any questions or concerns? No  Items Reviewed: Did the pt receive and understand the discharge instructions provided? Yes  Medications obtained and verified? Yes  Other? No  Any new allergies since your discharge? No  Dietary orders reviewed? No Do you have support at home? Yes   Functional Questionnaire: (I = Independent and D = Dependent) ADLs: I  Bathing/Dressing- I  Meal Prep- I  Eating- I  Maintaining continence- I  Transferring/Ambulation- I  Managing Meds- I  Follow up appointments reviewed:  PCP Hospital f/u appt confirmed? No   Specialist Hospital f/u appt confirmed? Yes  Scheduled to see Darrelyn Hillock, DO on 09/13/2021 @ 3:50pm. Are transportation arrangements needed? No  If their condition worsens, is the pt aware to call PCP or go to the Emergency Dept.? Yes Was the patient provided with contact information for the PCP's office or ED? Yes Was to pt encouraged to call back with questions or concerns? Yes

## 2021-08-23 ENCOUNTER — Encounter: Payer: Medicaid Other | Admitting: Advanced Practice Midwife

## 2021-08-26 ENCOUNTER — Telehealth (HOSPITAL_COMMUNITY): Payer: Self-pay | Admitting: *Deleted

## 2021-08-26 NOTE — Telephone Encounter (Signed)
Call cannot be completed.  Duffy Rhody, RN 08-26-2021 at 12:13pm

## 2021-09-13 ENCOUNTER — Telehealth: Payer: Self-pay | Admitting: *Deleted

## 2021-09-13 ENCOUNTER — Encounter: Payer: Self-pay | Admitting: Obstetrics & Gynecology

## 2021-09-13 ENCOUNTER — Ambulatory Visit: Payer: Medicaid Other | Admitting: Family Medicine

## 2021-09-13 NOTE — Telephone Encounter (Signed)
Called returned to pt. ?Call be taken care of by other means. ? ?

## 2021-09-20 ENCOUNTER — Ambulatory Visit (INDEPENDENT_AMBULATORY_CARE_PROVIDER_SITE_OTHER): Payer: Medicaid Other | Admitting: Obstetrics & Gynecology

## 2021-09-20 ENCOUNTER — Encounter: Payer: Self-pay | Admitting: Obstetrics & Gynecology

## 2021-09-20 ENCOUNTER — Other Ambulatory Visit: Payer: Self-pay

## 2021-09-20 NOTE — Progress Notes (Signed)
? ? ?  Post Partum Visit Note ? ?Corinthia Helmers is a 26 y.o. G62P1102 female who presents for a postpartum visit. She is 5 week postpartum following a normal spontaneous vaginal delivery.  I have fully reviewed the prenatal and intrapartum course. The delivery was at 38 gestational weeks.  Anesthesia: none. Postpartum course has been unremarkable. Baby is doing well. Baby is feeding by breast. Bleeding no bleeding. Bowel function is normal. Bladder function is normal. Patient is not sexually active. Contraception method is none. Postpartum depression screening: positive. ? ? ?The pregnancy intention screening data noted above was reviewed. Potential methods of contraception were discussed. The patient elected to proceed with waiting to decide ? ? ? ?Health Maintenance Due  ?Topic Date Due  ? COVID-19 Vaccine (1) Never done  ? INFLUENZA VACCINE  02/07/2021  ? PAP-Cervical Cytology Screening  07/22/2021  ? PAP SMEAR-Modifier  07/22/2021  ? ? ?The following portions of the patient's history were reviewed and updated as appropriate: allergies, current medications, past family history, past medical history, past social history, past surgical history, and problem list. ? ?Review of Systems ?Pertinent items are noted in HPI. ? ?Objective:  ?LMP 11/26/2020 (Approximate)   ? ?General:  alert, cooperative, and no distress  ? Breasts:  not indicated  ?Lungs: Effort normal  ?Heart:    ?Abdomen: soft, non-tender; bowel sounds normal; no masses,  no organomegaly   ?Wound N/a  ?GU exam:  not indicated  ?     ?Assessment:  ? ? There are no diagnoses linked to this encounter. ? ?Normal postpartum exam. Positive depression screen ? ?Plan:  ? ?Essential components of care per ACOG recommendations: ? ?1.  Mood and well being: Patient with positive depression screening today. Reviewed local resources for support.  ?- Patient tobacco use? No.   ?- hx of drug use? No.   ? ?2. Infant care and feeding:  ?-Patient currently breastmilk  feeding? Yes. Discussed returning to work and pumping.  ?-Social determinants of health (SDOH) reviewed in EPIC. No concerns, exuality, contraception and birth spacing ?- Patient does not know want a pregnancy in the next year.  Desired family size is 3 children.  ?- Reviewed reproductive life planning. Reviewed contraceptive methods based on pt preferences and effectiveness.  Patient desired Abstinence today.   ?- Discussed birth spacing of 18 months ? ?4. Sleep and fatigue ?-Encouraged family/partner/community support of 4 hrs of uninterrupted sleep to help with mood and fatigue ? ?5. Physical Recovery  ?- Discussed patients delivery and complications. She describes her labor as good. ?- Patient had a Vaginal, no problems at delivery. Patient had no laceration. Perineal healing reviewed. Patient expressed understanding ?- Patient has urinary incontinence? No. ?- Patient is safe to resume physical and sexual activity ? ?6.  Health Maintenance ?- HM due items addressed Yes ?- Last pap smear  ?Diagnosis  ?Date Value Ref Range Status  ?07/22/2018   Final  ? NEGATIVE FOR INTRAEPITHELIAL LESIONS OR MALIGNANCY.  ?07/22/2018 TRICHOMONAS VAGINALIS PRESENT.  Final  ? Pap smear not done at today's visit.  ?-Breast Cancer screening indicated? No. Behavioral health ? ?7. Chronic Disease/Pregnancy Condition follow up:  no ? ?- pap repeat soon ?Adam Phenix, MD ? ?Center for [employer], [facility]  ?

## 2021-09-22 ENCOUNTER — Ambulatory Visit (INDEPENDENT_AMBULATORY_CARE_PROVIDER_SITE_OTHER): Payer: Medicaid Other | Admitting: Licensed Clinical Social Worker

## 2021-09-22 DIAGNOSIS — Z91199 Patient's noncompliance with other medical treatment and regimen due to unspecified reason: Secondary | ICD-10-CM

## 2021-09-26 NOTE — BH Specialist Note (Signed)
Pt no show visit  

## 2021-11-21 ENCOUNTER — Ambulatory Visit: Payer: Medicaid Other | Admitting: Advanced Practice Midwife

## 2021-12-13 ENCOUNTER — Encounter: Payer: Self-pay | Admitting: *Deleted

## 2021-12-27 ENCOUNTER — Ambulatory Visit (INDEPENDENT_AMBULATORY_CARE_PROVIDER_SITE_OTHER): Payer: Medicaid Other | Admitting: Licensed Clinical Social Worker

## 2021-12-27 DIAGNOSIS — F53 Postpartum depression: Secondary | ICD-10-CM | POA: Diagnosis not present

## 2022-01-19 ENCOUNTER — Ambulatory Visit (INDEPENDENT_AMBULATORY_CARE_PROVIDER_SITE_OTHER): Payer: Medicaid Other | Admitting: Licensed Clinical Social Worker

## 2022-01-19 DIAGNOSIS — F53 Postpartum depression: Secondary | ICD-10-CM

## 2022-01-19 NOTE — BH Specialist Note (Signed)
Integrated Behavioral Health via Telemedicine Visit  01/19/2022 Helen Strickland [id number]  Number of Integrated Behavioral Health Clinician visits: 3- Third Visit  Session Start time: 1300   Session End time: 1326  Total time in minutes: 26 Via phone per pt request   Referring Provider: Dr. Debroah Loop  Patient/Family location: Home  [facility] Provider location: [facility]  All persons participating in visit: Pt Helen Strickland and LCSW A. Mahamed Zalewski  Types of Service: Individual psychotherapy and Telephone visit  I connected with Helen Strickland and/or Helen Strickland's n/a via  Telephone or Video Enabled Telemedicine Application  (Video is Caregility application) and verified that I am speaking with the correct person using two identifiers. Discussed confidentiality: Yes   I discussed the limitations of telemedicine and the availability of in person appointments.  Discussed there is a possibility of technology failure and discussed alternative modes of communication if that failure occurs.  I discussed that engaging in this telemedicine visit, they consent to the provision of behavioral healthcare and the services will be billed under their insurance.  Patient and/or legal guardian expressed understanding and consented to Telemedicine visit: Yes   Presenting Concerns: Patient and/or family reports the following symptoms/concerns: postpartum depression Duration of problem: approx 4 months ; Severity of problem: mild  Patient and/or Family's Strengths/Protective Factors: Concrete supports in place (healthy food, safe environments, etc.)  Goals Addressed: Patient will:  Reduce symptoms of: depression   Increase knowledge and/or ability of: coping skills   Demonstrate ability to: Increase healthy adjustment to current life circumstances  Progress towards Goals: Ongoing  Interventions: Interventions utilized:  Supportive Counseling and Link to [employer] Standardized Assessments  completed: Not Needed  Patient and/or Family Response: Helen Strickland responded well to ibh visit  Assessment: Patient currently experiencing postpartum depression.   Patient may benefit from integrated behavioral health.  Plan: Follow up with behavioral health clinician on : 3 weeks  Behavioral recommendations: Collaborate with community resources for additional support, prioritize rest, engage in self care,  Referral(s): Community Resources:  [employer]   I discussed the assessment and treatment plan with the patient and/or parent/guardian. They were provided an opportunity to ask questions and all were answered. They agreed with the plan and demonstrated an understanding of the instructions.   They were advised to call back or seek an in-person evaluation if the symptoms worsen or if the condition fails to improve as anticipated.  Gwyndolyn Saxon, LCSW

## 2022-02-14 ENCOUNTER — Ambulatory Visit: Payer: Medicaid Other | Admitting: Obstetrics and Gynecology

## 2022-04-12 NOTE — Progress Notes (Deleted)
    SUBJECTIVE:   CHIEF COMPLAINT / HPI:   Screen for PP depression,   PERTINENT  PMH / PSH: ***  OBJECTIVE:   There were no vitals taken for this visit.  ***  ASSESSMENT/PLAN:   No problem-specific Assessment & Plan notes found for this encounter.     Darci Current, [place]

## 2022-04-13 ENCOUNTER — Ambulatory Visit: Payer: Medicaid Other | Admitting: Student

## 2022-04-20 ENCOUNTER — Ambulatory Visit: Payer: Medicaid Other | Admitting: Family Medicine

## 2022-04-20 NOTE — Progress Notes (Deleted)
    SUBJECTIVE:   CHIEF COMPLAINT / HPI:   ***  PERTINENT  PMH / PSH: ***  OBJECTIVE:   There were no vitals taken for this visit.  ***  ASSESSMENT/PLAN:   No problem-specific Assessment & Plan notes found for this encounter.     Sherlyne Crownover, MD [facility]  

## 2022-04-27 ENCOUNTER — Ambulatory Visit: Payer: Medicaid Other | Admitting: Student

## 2022-11-15 ENCOUNTER — Encounter: Payer: Self-pay | Admitting: Student

## 2022-11-15 ENCOUNTER — Ambulatory Visit (INDEPENDENT_AMBULATORY_CARE_PROVIDER_SITE_OTHER): Payer: Medicaid Other | Admitting: Student

## 2022-11-15 ENCOUNTER — Other Ambulatory Visit: Payer: Self-pay

## 2022-11-15 VITALS — BP 123/87 | HR 95 | Ht 65.0 in | Wt 118.2 lb

## 2022-11-15 DIAGNOSIS — R413 Other amnesia: Secondary | ICD-10-CM | POA: Diagnosis not present

## 2022-11-15 DIAGNOSIS — M4126 Other idiopathic scoliosis, lumbar region: Secondary | ICD-10-CM
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
## 2022-11-15 NOTE — Patient Instructions (Addendum)
It was great to see you today!   Today we addressed: Memory loss, I am ordering a CT scan of your head. This will have to be approved by your insurance prior to scheduling. My office will call you once it is approved.  Back pain, I sent in a referral to PT, you should hear back from them in 1-2 weeks to schedule an appointment.   You should return to our clinic No follow-ups on file.  Future Appointments  Date Time Provider Department Center  11/21/2022  2:15 PM Glendale Chard, DO [facility]    Please arrive 15 minutes before your appointment to ensure smooth check in process.    Please call the clinic at [phone] if your symptoms worsen or you have any concerns.  Thank you for allowing me to participate in your care, Dr. Glendale Chard [facility]
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
## 2022-11-15 NOTE — Assessment & Plan Note (Signed)
Continues to have chronic pain.  Seems to be well-controlled with functional exercises and stretching.  She is interested in receiving physical therapy to strengthen core muscles and hopefully reduce pain.  Not interested in stronger pain medicines and is managed with Advil and Tylenol as needed.

## 2022-11-21 ENCOUNTER — Ambulatory Visit: Payer: Self-pay | Admitting: Student

## 2022-11-28 ENCOUNTER — Ambulatory Visit: Payer: Medicaid Other | Admitting: Student

## 2022-12-05 ENCOUNTER — Encounter: Payer: Self-pay | Admitting: Family Medicine

## 2022-12-05 ENCOUNTER — Ambulatory Visit (INDEPENDENT_AMBULATORY_CARE_PROVIDER_SITE_OTHER): Payer: Medicaid Other | Admitting: Family Medicine

## 2022-12-05 VITALS — BP 110/64 | HR 100 | Ht 65.0 in | Wt 114.0 lb

## 2022-12-05 DIAGNOSIS — M4126 Other idiopathic scoliosis, lumbar region: Secondary | ICD-10-CM

## 2022-12-05 MED ORDER — MELOXICAM 15 MG PO TABS: 15.0000 mg | ORAL_TABLET | Freq: Every day | ORAL | 0 refills | Status: DC | PRN

## 2022-12-05 NOTE — Progress Notes (Addendum)
    SUBJECTIVE:   CHIEF COMPLAINT / HPI:  Chief Complaint  Patient presents with   Weight Loss   Back Pain    Here with her 2 young children today.  Seen by PCP 3 weeks ago for chronic pain related to scoliosis, at that time seem to be well-controlled with NSAIDs and acetaminophen as needed.  She was referred to physical therapy at that time.  Patient presents today due to continued back pain.  She reports that she has had ongoing back pain for years.  She reports she saw an orthopedic specialist in the past but felt like her concerns were brushed off. She has flareups of back pain occasionally, unsure of frequency.  Has some level back pain every day.  Pain significantly limits her ability to work and limits her mobility as well.  Takes ibuprofen which does help some. Last flareup of back pain was 3 days ago.  Does not like taking medication but she did take ibuprofen.  She typically uses hot baths and stretching as well. Reports back pain is unchanged from most recent visit with PCP. Has not yet started PT, has appointment next Monday. Denies saddle anesthesia, bowel/bladder incontinence.  PERTINENT  PMH / PSH: Scoliosis  Patient Care Team: Glendale Chard, DO as PCP - General (Family Medicine)   OBJECTIVE:   BP 110/64   Pulse 100   Ht 5\' 5"  (1.651 m)   Wt 114 lb (51.7 kg)   LMP 10/15/2022   SpO2 99%   BMI 18.97 kg/m   Physical Exam Constitutional:      General: She is not in acute distress. Cardiovascular:     Rate and Rhythm: Normal rate.  Pulmonary:     Effort: Pulmonary effort is normal. No respiratory distress.  Musculoskeletal:     Cervical back: Neck supple.     Comments: No tenderness down midline spine or paraspinal muscles.  Neurological:     Mental Status: She is alert.   Exam limited by patient need to hold her daughter due to fussiness     08/15/2021    2:13 PM  Depression screen PHQ 2/9  Decreased Interest 0  Down, Depressed, Hopeless 0  PHQ - 2  Score 0  Altered sleeping 1  Tired, decreased energy 1  Change in appetite 0  Feeling bad or failure about yourself  0  Trouble concentrating 0  Moving slowly or fidgety/restless 0  Suicidal thoughts 0  PHQ-9 Score 2     {Show previous vital signs (optional):23777}    ASSESSMENT/PLAN:   Scoliosis Continues to have chronic back pain.  Prior XR reviewed consistent with mild scoliosis.  No red flag signs or symptoms.  He does not feel ibuprofen is helping too much.  I am hopeful her back pain will improve once he starts physical therapy. - advised to use Tylenol first line - rx meloxicam 15 mg daily prn    Return if symptoms worsen or fail to improve.   Littie Deeds, MD [facility]

## 2022-12-05 NOTE — Assessment & Plan Note (Signed)
Continues to have chronic back pain.  Prior XR reviewed consistent with mild scoliosis.  No red flag signs or symptoms.  He does not feel ibuprofen is helping too much.  I am hopeful her back pain will improve once he starts physical therapy. - advised to use Tylenol first line - rx meloxicam 15 mg daily prn

## 2022-12-05 NOTE — Patient Instructions (Addendum)
It was nice seeing you today!  Take meloxicam one a day as needed during flareups. Do not take any ibuprofen with this.  You can combine this with Tylenol.  Stay well, Littie Deeds, MD [facility] [phone]  --  Make sure to check out at the front desk before you leave today.  Please arrive at least 15 minutes prior to your scheduled appointments.  If you had blood work today, I will send you a MyChart message or a letter if results are normal. Otherwise, I will give you a call.  If you had a referral placed, they will call you to set up an appointment. Please give [place] a call if you don't hear back in the next 2 weeks.  If you need additional refills before your next appointment, please call your pharmacy first.

## 2022-12-07 ENCOUNTER — Telehealth: Payer: Self-pay | Admitting: *Deleted

## 2022-12-07 ENCOUNTER — Encounter: Payer: Self-pay | Admitting: Family Medicine

## 2022-12-07 NOTE — Telephone Encounter (Signed)
Patient left message on referral asking for a to be referred to psychiatry.  Will send to MD.  Helen Strickland

## 2022-12-08 ENCOUNTER — Encounter: Payer: Self-pay | Admitting: Family Medicine

## 2022-12-11 ENCOUNTER — Ambulatory Visit: Payer: Medicaid Other

## 2022-12-11 NOTE — Telephone Encounter (Signed)
Called patient to discuss psychiatry referral. Unable to leave voicemail and voice mail box was not set up.   Patient will need to schedule an office visit to discuss her psychiatric needs prior to sending a referral as we have not discussed her mental health in prior visits. I am more than happy to place a referral after seeing her outpatient.   Glendale Chard, DO [facility], PGY-1 12/11/22 7:45 PM

## 2022-12-14 NOTE — Therapy (Deleted)
OUTPATIENT PHYSICAL THERAPY THORACOLUMBAR EVALUATION   Patient Name: Helen Strickland MRN: [id number] DOB:[birth year], 27 y.o., female Today's Date: 12/14/2022  END OF SESSION:   Past Medical History:  Diagnosis Date   Asthma    exercise induced asthma; "been years since used inhaler"   Former smoker    History of marijuana use    not while pregnant-2016    Short cervical length during pregnancy 12/27/2014   Past Surgical History:  Procedure Laterality Date   NO PAST SURGERIES     Patient Active Problem List   Diagnosis Date Noted   Memory loss, short term 11/15/2022   Scoliosis 02/06/2019    PCP: Glendale Chard, DO   REFERRING PROVIDER: Doreene Eland, MD  REFERRING DIAG: [phone] (ICD-10-CM) - Other idiopathic scoliosis, lumbar region  Rationale for Evaluation and Treatment: Rehabilitation  THERAPY DIAG:  No diagnosis found.  ONSET DATE: chronic  SUBJECTIVE:                                                                                                                                                                                           SUBJECTIVE STATEMENT: ***  PERTINENT HISTORY:  Scoliosis:  - every day chronic back pain  - daily stretching and yoga seems to maintain her function  - sometimes experiences flares of back pain  - standing long periods or bending over exacerbate her pain - during flares she takes tylenol and ibuprofen over the counter   PAIN:  Are you having pain? {OPRCPAIN:27236}  PRECAUTIONS: None  WEIGHT BEARING RESTRICTIONS: No  FALLS:  Has patient fallen in last 6 months? No  OCCUPATION: ***  PLOF: Independent  PATIENT GOALS: ***  NEXT MD VISIT: ***  OBJECTIVE:   DIAGNOSTIC FINDINGS:  EXAM: LUMBAR SPINE - COMPLETE 4+ VIEW   COMPARISON:  None.   FINDINGS: There is rotoscoliosis of the lumbar spine, convex to the LEFT. Measured from L2-L4 there is 19 degrees of scoliosis. No hemi vertebrae. No spondylolisthesis  or spondylolysis. No lytic or blastic lesions.   IMPRESSION: 1. Scoliosis. 2. No evidence for acute abnormality.     Electronically Signed   By: Norva Pavlov M.D.   On: 01/31/2019 15:57  PATIENT SURVEYS:  FOTO ***  SCREENING FOR RED FLAGS: Bowel or bladder incontinence: {Yes/No:304960894} Spinal tumors: {Yes/No:304960894} Cauda equina syndrome: {Yes/No:304960894} Compression fracture: {Yes/No:304960894} Abdominal aneurysm: {Yes/No:304960894}  COGNITION: Overall cognitive status:  hx of STM loss      MUSCLE LENGTH: Hamstrings: Right *** deg; Left *** deg Thomas test: Right *** deg; Left *** deg  POSTURE: {posture:25561}  PALPATION: ***  LUMBAR ROM:   AROM eval  Flexion   Extension   Right lateral flexion   Left lateral flexion   Right rotation   Left rotation    (Blank rows = not tested)  LOWER EXTREMITY ROM:     {AROM/PROM:27142}  Right eval Left eval  Hip flexion    Hip extension    Hip abduction    Hip adduction    Hip internal rotation    Hip external rotation    Knee flexion    Knee extension    Ankle dorsiflexion    Ankle plantarflexion    Ankle inversion    Ankle eversion     (Blank rows = not tested)  LOWER EXTREMITY MMT:    MMT Right eval Left eval  Hip flexion    Hip extension    Hip abduction    Hip adduction    Hip internal rotation    Hip external rotation    Knee flexion    Knee extension    Ankle dorsiflexion    Ankle plantarflexion    Ankle inversion    Ankle eversion     (Blank rows = not tested)  LUMBAR SPECIAL TESTS:  {lumbar special test:25242}  FUNCTIONAL TESTS:  {Functional tests:24029}  GAIT: Distance walked: 54ft x2 Assistive device utilized: None Level of assistance: Complete Independence Comments: ***  TODAY'S TREATMENT:                                                                                                                              DATE: ***    PATIENT EDUCATION:  Education  details: Discussed eval findings, rehab rationale and POC and patient is in agreement  Person educated: Patient Education method: Explanation Education comprehension: verbalized understanding and needs further education  HOME EXERCISE PROGRAM: ***  ASSESSMENT:  CLINICAL IMPRESSION: Patient is a *** y.o. *** who was seen today for physical therapy evaluation and treatment for ***.   OBJECTIVE IMPAIRMENTS: {opptimpairments:25111}.   ACTIVITY LIMITATIONS: {activitylimitations:27494}  PARTICIPATION LIMITATIONS: {participationrestrictions:25113}  REHAB POTENTIAL: Good  CLINICAL DECISION MAKING: Stable/uncomplicated  EVALUATION COMPLEXITY: Low   GOALS: Goals reviewed with patient? {yes/no:20286}  SHORT TERM GOALS: Target date: ***  *** Baseline: Goal status: {GOALSTATUS:25110}  2.  *** Baseline:  Goal status: {GOALSTATUS:25110}  3.  *** Baseline:  Goal status: {GOALSTATUS:25110}  4.  *** Baseline:  Goal status: {GOALSTATUS:25110}  5.  *** Baseline:  Goal status: {GOALSTATUS:25110}  6.  *** Baseline:  Goal status: {GOALSTATUS:25110}  LONG TERM GOALS: Target date: ***  *** Baseline:  Goal status: {GOALSTATUS:25110}  2.  *** Baseline:  Goal status: {GOALSTATUS:25110}  3.  *** Baseline:  Goal status: {GOALSTATUS:25110}  4.  *** Baseline:  Goal status: {GOALSTATUS:25110}  5.  *** Baseline:  Goal status: {GOALSTATUS:25110}  6.  *** Baseline:  Goal status: {GOALSTATUS:25110}  PLAN:  PT FREQUENCY: 1-2x/week  PT DURATION: 6 weeks  PLANNED INTERVENTIONS: Therapeutic exercises, Therapeutic activity, Neuromuscular re-education, Balance training, Gait training, Patient/Family education, Self Care, Joint  mobilization, Dry Needling, Electrical stimulation, Spinal mobilization, Cryotherapy, Moist heat, Manual therapy, and Re-evaluation.  PLAN FOR NEXT SESSION: HEP review and update, manual techniques as appropriate, aerobic tasks, ROM and flexibility  activities, strengthening and PREs, TPDN, gait and balance training as needed     Hildred Laser, PT 12/14/2022, 4:02 PM

## 2022-12-15 ENCOUNTER — Telehealth: Payer: Self-pay | Admitting: *Deleted

## 2022-12-15 ENCOUNTER — Ambulatory Visit: Payer: Medicaid Other

## 2022-12-15 ENCOUNTER — Ambulatory Visit
Admission: RE | Admit: 2022-12-15 | Discharge: 2022-12-15 | Disposition: A | Discharge status: Home / Self Care | Payer: Medicaid Other | Source: Ambulatory Visit | Attending: Family Medicine | Admitting: Family Medicine

## 2022-12-15 DIAGNOSIS — R413 Other amnesia: Secondary | ICD-10-CM

## 2022-12-15 NOTE — Telephone Encounter (Signed)
Patient calls nurse line, requesting a call back from manager.  Is dissatisfied with documentation of care she received.  Attempted to call back, no answer and no machine. Jone Baseman, CMA

## 2022-12-15 NOTE — Telephone Encounter (Deleted)
-----   Message from Doreene Eland, MD sent at 12/14/2022  5:08 PM EDT ----- Selina Cooley,  Since she specifically requested the practice manager, I'll say you call first and see if things can be addressed. If not, I can call her after. Thanks.  ----- Message ----- From: Osborne Oman, CMA Sent: 12/14/2022   4:50 PM EDT To: Doreene Eland, MD  Hey Dr. Lum Babe,  Would you like to call her since it is a resident issue?  ----- Message ----- From: Veronda Prude, RN Sent: 12/13/2022  11:55 AM EDT To: Osborne Oman, CMA  Patient calls nurse line stating that providers are not documenting her concerns correctly in the last few office notes. She last saw Dr. Wynelle Link and Dr. Hyacinth Meeker.   She states that they are paraphrasing what is going on with her and she feels that this is "medical negligence."   She would like to speak with manager.   Requesting returned call at [phone].  Veronda Prude, RN

## 2022-12-18 ENCOUNTER — Telehealth: Payer: Self-pay | Admitting: Student

## 2022-12-18 NOTE — Telephone Encounter (Signed)
Called patient to discuss imaging results of CT head. Overall imaging looks great with no acute concerns. Patient voiced understanding of results. She has an appointment scheduled with Dr. Larita Fife tomorrow to continue discussing her headaches.   Glendale Chard, DO [facility], PGY-1 12/18/22 8:05 AM

## 2022-12-19 ENCOUNTER — Ambulatory Visit (INDEPENDENT_AMBULATORY_CARE_PROVIDER_SITE_OTHER): Payer: Medicaid Other | Admitting: Family Medicine

## 2022-12-19 ENCOUNTER — Encounter: Payer: Self-pay | Admitting: Family Medicine

## 2022-12-19 VITALS — BP 107/85 | HR 84 | Ht 64.76 in | Wt 118.0 lb

## 2022-12-19 DIAGNOSIS — R4589 Other symptoms and signs involving emotional state: Secondary | ICD-10-CM | POA: Diagnosis not present

## 2022-12-19 DIAGNOSIS — Z62810 Personal history of physical and sexual abuse in childhood: Secondary | ICD-10-CM | POA: Insufficient documentation

## 2022-12-19 DIAGNOSIS — R41840 Attention and concentration deficit: Secondary | ICD-10-CM

## 2022-12-19 DIAGNOSIS — M4126 Other idiopathic scoliosis, lumbar region: Secondary | ICD-10-CM | POA: Diagnosis not present

## 2022-12-19 DIAGNOSIS — D649 Anemia, unspecified: Secondary | ICD-10-CM | POA: Insufficient documentation

## 2022-12-19 DIAGNOSIS — R413 Other amnesia: Secondary | ICD-10-CM | POA: Diagnosis not present

## 2022-12-19 DIAGNOSIS — F4310 Post-traumatic stress disorder, unspecified: Secondary | ICD-10-CM | POA: Insufficient documentation

## 2022-12-19 DIAGNOSIS — Z87898 Personal history of other specified conditions: Secondary | ICD-10-CM | POA: Diagnosis not present

## 2022-12-19 MED ORDER — ESCITALOPRAM OXALATE 10 MG PO TABS: ORAL_TABLET | ORAL | 0 refills | Status: DC

## 2022-12-19 NOTE — Assessment & Plan Note (Addendum)
Unsure if this is the entirety of back pain etiology. Repeat scoliosis films - if unchanged, no need to repeat. Patient to call PT and re-schedule appointment with them. Believe PT would be very helpful for this back pain. Continue meloxicam PRN; sparse use discussed with patient, talked about risk for intestinal bleeding. Also talked about role of mental health and trauma in chronic pain. If no improvement with PT, meloxicam, and mental health treatments, could consider trigger point evaluation and injection, referral to orthopedics or sports medicine +/- more advanced imaging.

## 2022-12-19 NOTE — Assessment & Plan Note (Signed)
Will recheck CBC with blood work today.
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
## 2022-12-19 NOTE — Patient Instructions (Addendum)
It was wonderful to see you today. Thank you for allowing me to be a part of your care. Below is a short summary of what we discussed at your visit today:  Back pain, scoliosis Let's redo x-rays to see if anything has changed. Go to physical therapy and see if that helps.  Try an over-the-counter muscle rub for intermittent relief.  Use meloxicam sparingly to help when tylenol is not helpful.   Physical therapy [facility] at [facility]: [address] [place], [place], [place] [address] Phone: [phone]  X-rays You can go as a walk in during normal business hours.  [facility] [address] [place], [place], [place] [address] [phone] Mon-Fri 8-5     Mental health I have referred you to psychiatry.  Someone from their office should be calling you in 1 to 2 weeks to schedule an appointment.  If you do not hear from them, let us know. We may need to nudge along the referral.    See below for therapy resources who take your insurance.   Please bring all of your medications to every appointment!  If you have any questions or concerns, please do not hesitate to contact us via phone or MyChart message.   Fayette Pho, MD    Therapy and Counseling Resources Most providers on this list will take Medicaid. Patients with commercial insurance or Medicare should contact their insurance company to get a list of in network providers.  [facility] (takes children) Location 1: [address] [place], [place] [address] Location 2: [address] [place], [place] [address] [phone]  [facility] (spanish speaking therapist available)(habla espanol)(take medicare and medicaid)  [address], [place], [place] [address], [email or web address] [phone]  [facility] [address] [place], [place] [address] [phone]  [facility]   [address], [address] [place],  [place] [address]      [phone]  [facility] (spanish available) [address]  [place], [place] [address] [phone]  [facility] (take [place] [facility] and medicare) [address]  [place], [place] [address]       [phone]     [facility] (virtual only) [phone]  [facility] [address] [place], [place] [phone]  [facility]:  [address] [place] [phone]  [facility]:  [address] [place] [place]ien[place] [phone]  [facility] (under & uninsured) [address]   [place] [phone]    [email or web address]    [facility] [address]  [place]    [phone]  [facility] -[address]     Phone:  [phone]     [facility]  [address]  [phone]   [facility] [address]      [place], [place] [phone]  [facility]: [address] [place], [place], [place]  [phone]   [facility] (Spanish therapist) [email or web address]  [address] [place]  [address]   [place] [address]    [phone]    [facility]   [address],  [place], [place]  [phone]   [facility]  [address] [place]  [phone]  [facility]  [address] [place], [place]        [phone]  Open Access/Walk In Clinic under & uninsured  [facility]  [address] [place], [place] [phone] Crisis [phone]  [facility] [place],  (Spanish)   [address], [place]: [phone]) 8:30 - 12; 1 - 2:30  [facility] [employer],  [address] [place], [place]    [phone]):8:30 - 12; 2 - 3PM  [facility] [employer],  [address]  [place];  [phone]):   Mon - Fri 8 AM - 5 PM  [facility] [address] [employer] [place]  MWF 12:30 to 3:00 or call to schedule an appointment  [phone]  Specific Provider options Psychology Today  [email or web address] click on find a therapist  enter your zip code left side and select or tailor a therapist for your specific need.   [facility] Provider Directory [email or web address]  (Medicaid)   Follow all drop down to find a provider  Social Support program Mental Health [place] [phone] or PhotoSolver.pl [address], [place], [place] Recovery support and educational   24- Hour Availability:   [facility]  [address] [place], [place] [phone] Crisis [phone]  [facility] [phone]  [facility]  [phone]   [facility]  [phone] (after hours)  Therapeutic Alternative/Mobile Crisis   [phone]  [place] National Suicide Hotline  [phone] [address])  Call 911 or go to emergency room  [facility]  [phone]);  [place] and [employer]  [phone]); [place], [place], [place], [place], [place], [place], [place]

## 2022-12-19 NOTE — Assessment & Plan Note (Signed)
And depressed mood. Patient amenable to trying Lexapro again.  - lexapro 5 mg daily x 14 days then 10 mg daily - follow up 3 weeks with PCP for tolerance - recommend therapy, Medicaid therapy list provided in AVS - ref psychiatry - ref Managed Medicaid care management RN or SW to help get connected to psych

## 2022-12-19 NOTE — Assessment & Plan Note (Signed)
CT head re-assuring. Discussed that I would not want to simply diagnose the memory issues as concussion related, given that head injury was 2 years ago and concussion symptoms should have abated. Differential is broad, but includes PTSD, TBI, anemia, B12 or folate deficiency, hypothyroidism, RPR, and HIV. Will collect labs today. Unfortunately, HIV only came to mind after precepting with Dr. Lum Babe; unable to be added on to blood work already collected today. Can obtain at next visit.

## 2022-12-20 LAB — HEMOGLOBIN A1C
Est. average glucose Bld gHb Est-mCnc: 103 mg/dL
Hgb A1c MFr Bld: 5.2 % (ref 4.8–5.6)

## 2022-12-20 LAB — CBC
Hematocrit: 37.6 % (ref 34.0–46.6)
Hemoglobin: 12.5 g/dL (ref 11.1–15.9)
MCH: 30.3 pg (ref 26.6–33.0)
MCHC: 33.2 g/dL (ref 31.5–35.7)
MCV: 91 fL (ref 79–97)
Platelets: 272 10*3/uL (ref 150–450)
RBC: 4.12 x10E6/uL (ref 3.77–5.28)
RDW: 13.6 % (ref 11.7–15.4)
WBC: 5.3 10*3/uL (ref 3.4–10.8)

## 2022-12-20 LAB — BASIC METABOLIC PANEL
BUN/Creatinine Ratio: 14 (ref 9–23)
BUN: 11 mg/dL (ref 6–20)
CO2: 22 mmol/L (ref 20–29)
Calcium: 9.4 mg/dL (ref 8.7–10.2)
Chloride: 103 mmol/L (ref 96–106)
Creatinine, Ser: 0.79 mg/dL (ref 0.57–1.00)
Glucose: 75 mg/dL (ref 70–99)
Potassium: 4.4 mmol/L (ref 3.5–5.2)
Sodium: 139 mmol/L (ref 134–144)
eGFR: 106 mL/min/{1.73_m2} (ref >59)

## 2022-12-20 LAB — TSH: TSH: 0.583 u[IU]/mL (ref 0.450–4.500)

## 2022-12-21 LAB — RPR: RPR Ser Ql: NONREACTIVE

## 2022-12-21 LAB — SPECIMEN STATUS REPORT

## 2022-12-21 LAB — VITAMIN B12: Vitamin B-12: 1084 pg/mL (ref 232–1245)

## 2022-12-21 LAB — FOLATE: Folate: 11.1 ng/mL (ref >3.0)

## 2022-12-28 ENCOUNTER — Other Ambulatory Visit: Payer: Self-pay

## 2022-12-28 ENCOUNTER — Telehealth: Payer: Self-pay | Admitting: *Deleted

## 2022-12-28 ENCOUNTER — Ambulatory Visit: Payer: Medicaid Other | Attending: Family Medicine

## 2022-12-28 ENCOUNTER — Ambulatory Visit: Payer: Medicaid Other | Admitting: Family Medicine

## 2022-12-28 DIAGNOSIS — M542 Cervicalgia: Secondary | ICD-10-CM | POA: Insufficient documentation

## 2022-12-28 DIAGNOSIS — M6281 Muscle weakness (generalized): Secondary | ICD-10-CM | POA: Diagnosis not present

## 2022-12-28 DIAGNOSIS — M5459 Other low back pain: Secondary | ICD-10-CM | POA: Insufficient documentation

## 2022-12-28 DIAGNOSIS — M4126 Other idiopathic scoliosis, lumbar region: Secondary | ICD-10-CM | POA: Diagnosis not present

## 2022-12-28 NOTE — Telephone Encounter (Signed)
Pt is calling with complaint.  Returned call on 12/21/22.  Pt was polite but reluctant to go into detail about what happened with provider.  Seems she would be more comfortable speaking with Dr. Lum Babe as the medical director.  Jone Baseman, CMA

## 2022-12-28 NOTE — Telephone Encounter (Signed)
I called and discussed her recent with Dr. Wynelle Link.  The patient stated that most of the information provided to Dr. Wynelle Link during her visit was not documented. She informed him of daily back pain due to her scoliosis and how it affects her mobility. She requested this in her note.  Upon record review, it appears that Dr. Wynelle Link documented daily back pain in his note. Although no mention of mobility impairment.  I will check in with Dr. Wynelle Link regarding updating that visit with this information. She was appreciative of the call.
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
## 2022-12-29 ENCOUNTER — Other Ambulatory Visit: Payer: Medicaid Other | Admitting: Licensed Clinical Social Worker

## 2022-12-29 ENCOUNTER — Telehealth: Payer: Self-pay | Admitting: Student

## 2022-12-29 DIAGNOSIS — Z62810 Personal history of physical and sexual abuse in childhood: Secondary | ICD-10-CM

## 2022-12-29 DIAGNOSIS — F4310 Post-traumatic stress disorder, unspecified: Secondary | ICD-10-CM

## 2022-12-29 DIAGNOSIS — Z87898 Personal history of other specified conditions: Secondary | ICD-10-CM
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
## 2022-12-29 NOTE — Patient Instructions (Signed)
Visit Information  Ms. Schlereth was given information about Medicaid Managed Care team care coordination services as a part of their [facility] Medicaid benefit. Aeriel Boulay verbally consented to engagement with the [facility] Managed Care team.   If you are experiencing a medical emergency, please call 911 or report to your local emergency department or urgent care.   If you have a non-emergency medical problem during routine business hours, please contact your provider's office and ask to speak with a nurse.   For questions related to your Healthy [facility] health plan, please call: [phone] or visit the homepage here: [email or web address]  If you would like to schedule transportation through your Healthy [facility] plan, please call the following number at least 2 days in advance of your appointment: [phone]  For information about your ride after you set it up, call Ride Assist at [phone]. Use this number to activate a Will Call pickup, or if your transportation is late for a scheduled pickup. Use this number, too, if you need to make a change or cancel a previously scheduled reservation.  If you need transportation services right away, call [phone]. The after-hours call center is staffed 24 hours to handle ride assistance and urgent reservation requests (including discharges) 365 days a year. Urgent trips include sick visits, hospital discharge requests and life-sustaining treatment.  Call the [facility] Line at [phone], at any time, 24 hours a day, 7 days a week. If you are in danger or need immediate medical attention call 911.  If you would like help to quit smoking, call 1-800-QUIT-NOW (986-196-4927) OR Espaol: 1-855-Djelo-Ya (4-742-595-6387) o para ms informacin haga clic aqu or Text READY to 564-332 to register via text   Following is a copy of your plan of care:  Care Plan : LCSW Plan of Care   Updates made by [name], LCSW since 12/29/2022 12:00 AM     Problem: Anxiety Identification (Anxiety)      Goal: Anxiety Symptoms Identified   Start Date: 12/29/2022  Note:   Priority: High  Timeframe:  Short-Range Goal Priority:  High Start Date:   12/29/22         Expected End Date:  ongoing                     Follow Up Date--01/12/23 at 2pm  - keep 90 percent of scheduled appointments -consider increasing your socialization as much as possible and avoid negative people, places or things that trigger your mental health and overall well-being -consider bumping up your self-care  -consider creating a stronger support network   Why is this important?             Combatting trauma and stress may take some time.            If you don't feel better right away, don't give up on your treatment plan.    Current barriers:   Chronic Mental Health needs related to PTSD. Patient requires Support, Education, Resources, Referrals, Advocacy, and Care Coordination, in order to meet Unmet Mental Health Needs. NO support network Patient will implement clinical interventions discussed today to decrease symptoms of PTSD and increase knowledge and/or ability of: coping skills. Mental Health Concerns and Social Isolation Patient lacks knowledge of available community counseling agencies and resources.  Clinical Goal(s): verbalize understanding of plan for management of PTSD and demonstrate a reduction in symptoms. Patient will connect with a provider for ongoing mental health treatment, increase coping skills,  healthy habits, self-management skills, and stress reduction        Patient Goals/Self-Care Activities: Over the next 120 days Attend scheduled medical appointments Utilize healthy coping skills and supportive resources discussed Contact PCP with any questions or concerns Keep 90 percent of counseling appointments Call your insurance provider for more information about your Enhanced  Benefits  Check out counseling resources provided  Begin personal counseling with LCSW, to reduce and manage symptoms of Depression and Stress, until well-established with mental health provider Accept all calls from representative with [facility] in an effort to establish ongoing mental health counseling and supportive services. Incorporate into daily practice - relaxation techniques, deep breathing exercises, and mindfulness meditation strategies. Talk about feelings with friends, family members, spiritual advisor, etc. Contact LCSW directly [phone]), if you have questions, need assistance, or if additional social work needs are identified between now and our next scheduled telephone outreach call. Call 988 for mental health hotline/crisis line if needed (24/7 available) Try techniques to reduce symptoms of anxiety/negative thinking (deep breathing, distraction, positive self talk, etc)  - develop a personal safety plan - develop a plan to deal with triggers like holidays, anniversaries - exercise at least 2 to 3 times per week - have a plan for how to handle bad days - journal feelings and what helps to feel better or worse - spend time or talk with others at least 2 to 3 times per week - watch for early signs of feeling worse - begin personal counseling - call and visit an old friend - check out volunteer opportunities - join a support group - laugh; watch a funny movie or comedian - learn and use visualization or guided imagery - perform a random act of kindness - practice relaxation or meditation daily - start or continue a personal journal - practice positive thinking and self-talk -continue with compliance of taking medication  -identify current effective and ineffective coping strategies.  -implement positive self-talk in care to increase self-esteem, confidence and feelings of control.  -consider alternative and complementary therapy approaches such as meditation, mindfulness  or yoga.  -journaling, prayer, worship services, meditation or pastoral counseling.  -increase participation in pleasurable group activities such as hobbies, singing, sports or volunteering).  -consider the use of meditative movement therapy such as tai chi, yoga or qigong.  -start a regular daily exercise program based on tolerance, ability and patient choice to support positive thinking and activity    If you are experiencing a Mental Health or Behavioral Health Crisis or need someone to talk to, please call the Suicide and Crisis Lifeline: 988    Patient Goals: Initial goal    Helen Strickland, BSW, MSW, [employer] Managed Medicaid LCSW [facility]  [facility] [email or web address] Phone: [phone]
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
## 2023-01-01 ENCOUNTER — Ambulatory Visit: Payer: Medicaid Other | Admitting: Student

## 2023-01-01 NOTE — Progress Notes (Deleted)
    SUBJECTIVE:   CHIEF COMPLAINT / HPI: Obtain pap smear  Pap Smear: Patient is a 27 y.o. female presenting for pap smear.  She states she does not have any discharge, odor or vaginal symptosm.  She is not *** interested in screening for sexually transmitted infections today. She is on contraception with ***  PERTINENT  PMH / PSH: ***None relevant  OBJECTIVE:   LMP 12/18/2022    General: NAD, pleasant, able to participate in exam Respiratory: Normal effort, no obvious respiratory distress Pelvic: VULVA: normal appearing vulva with no masses, tenderness or lesions, VAGINA: Normal appearing vagina with normal color, no lesions, with {GYN VAGINAL DISCHARGE:21986} discharge present, ***CERVIX: No lesions, {GYN VAGINAL DISCHARGE:21986} discharge present. Pap smear performed with cytobrush and spatula  Chaperone *** CMA present for pelvic exam  ASSESSMENT/PLAN:   No problem-specific Assessment & Plan notes found for this encounter.    Assessment:  27 y.o. female here for pap smear.  Physical exam significant for*** discharge.  Patient is not*** interested in STI screening.   Plan: -F/u pap smear results -Gonnorhea, chlamydia *** -RPR, HIV *** -Follow-up as needed  Levin Erp, MD [facility]

## 2023-01-12 ENCOUNTER — Other Ambulatory Visit: Payer: Medicaid Other | Admitting: Licensed Clinical Social Worker

## 2023-01-12 NOTE — Patient Outreach (Signed)
  Medicaid Managed Care   Unsuccessful Attempt Note   01/12/2023 Name: [name] MRN: [id number] DOB: 08-16-1995  Referred by: Glendale Chard, DO Reason for referral : No chief complaint on file.   An unsuccessful telephone outreach was attempted today. The patient was referred to the case management team for assistance with care management and care coordination.    Follow Up Plan: A HIPAA compliant phone message was left for the patient providing contact information and requesting a return call.   Dickie La, BSW, MSW, [employer] Managed Medicaid LCSW [facility]  [facility] [email or web address] Phone: [phone]

## 2023-01-12 NOTE — Patient Instructions (Signed)
[name] ,   The [facility] Managed Care Team is available to provide assistance to you with your healthcare needs at no cost and as a benefit of your [facility] Health plan. I'm sorry I was unable to reach you today for our scheduled appointment. Our care guide will call you to reschedule our telephone appointment. Please call me at the number below. I am available to be of assistance to you regarding your healthcare needs. .   Thank you,   Dickie La, BSW, MSW, LCSW Managed Medicaid LCSW [facility]  [email or web address] Phone: [phone]

## 2023-01-15 ENCOUNTER — Ambulatory Visit: Payer: Medicaid Other

## 2023-01-22 ENCOUNTER — Ambulatory Visit: Payer: Medicaid Other

## 2023-01-29 ENCOUNTER — Ambulatory Visit: Payer: Medicaid Other

## 2023-01-30 ENCOUNTER — Other Ambulatory Visit: Payer: Medicaid Other | Admitting: Licensed Clinical Social Worker

## 2023-01-30 DIAGNOSIS — F4310 Post-traumatic stress disorder, unspecified: Secondary | ICD-10-CM

## 2023-01-30 DIAGNOSIS — Z62810 Personal history of physical and sexual abuse in childhood: Secondary | ICD-10-CM
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
## 2023-02-02 ENCOUNTER — Other Ambulatory Visit (HOSPITAL_COMMUNITY)
Admission: RE | Admit: 2023-02-02 | Discharge: 2023-02-02 | Disposition: A | Discharge status: Home / Self Care | Payer: Medicaid Other | Source: Ambulatory Visit | Attending: Family Medicine | Admitting: Family Medicine

## 2023-02-02 ENCOUNTER — Ambulatory Visit (INDEPENDENT_AMBULATORY_CARE_PROVIDER_SITE_OTHER): Payer: Medicaid Other | Admitting: Student

## 2023-02-02 DIAGNOSIS — Z124 Encounter for screening for malignant neoplasm of cervix: Secondary | ICD-10-CM

## 2023-02-02 DIAGNOSIS — F4310 Post-traumatic stress disorder, unspecified: Secondary | ICD-10-CM

## 2023-02-02 DIAGNOSIS — K649 Unspecified hemorrhoids: Secondary | ICD-10-CM | POA: Diagnosis not present

## 2023-02-02 DIAGNOSIS — Z113 Encounter for screening for infections with a predominantly sexual mode of transmission: Secondary | ICD-10-CM | POA: Diagnosis not present

## 2023-02-02 DIAGNOSIS — R4589 Other symptoms and signs involving emotional state: Secondary | ICD-10-CM | POA: Diagnosis not present

## 2023-02-02 MED ORDER — ESCITALOPRAM OXALATE 20 MG PO TABS
20.0000 mg | ORAL_TABLET | Freq: Every day | ORAL | 0 refills | Status: DC
Start: 2023-02-02 — End: 2023-02-13

## 2023-02-02 MED ORDER — POLYETHYLENE GLYCOL 3350 17 GM/SCOOP PO POWD: 17.0000 g | Freq: Every day | ORAL | 1 refills | Status: DC | PRN

## 2023-02-02 MED ORDER — HYDROCORTISONE ACETATE 25 MG RE SUPP
25.0000 mg | Freq: Three times a day (TID) | RECTAL | 3 refills | Status: DC
Start: 2023-02-02 — End: 2023-02-13
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
## 2023-02-02 NOTE — Patient Instructions (Addendum)
It was great to see you! Thank you for allowing me to participate in your care!  I recommend that you always bring your medications to each appointment as this makes it easy to ensure you are on the correct medications and helps [place] not miss when refills are needed.  Our plans for today:  - I sent in a prescription for hydrocortisone suppository.  Use 1 suppository 3 times a day during an active hemorrhoid flare and use until symptoms resolve.  I sent in refills to have on hand.   -See the attached information regarding supportive care including sitz bath's and increasing fiber in diet -The goal is for you to have 1 soft smooth bowel movement daily without straining.  -Purchase over the counter metamucil. Dissolve in a glass of water once daily -I also sent a prescription of miralax to have on hand if needed. It is a laxative and can take 3 days to work. -A new prescription for increase dose of lexapro 20 mg sent to pharmacy -Appointment made for next week with PCP to discuss emotional support animal   We are checking some labs today, I will call you if they are abnormal will send you a MyChart message or a letter if they are normal.  If you do not hear about your labs in the next 2 weeks please let us know.  Take care and seek immediate care sooner if you develop any concerns.   Dr. Erick Alley, DO [facility]

## 2023-02-02 NOTE — Assessment & Plan Note (Signed)
Has appointment scheduled with psychiatry in August.  Denies SI.  Lexapro increased to 20 mg daily.  Appointment made with PCP next week to discuss emotional support animal

## 2023-02-02 NOTE — Assessment & Plan Note (Signed)
Hydrocortisone suppository sent to pharmacy.  Can use 3 times daily when she is having a flare.  Also provided information on supportive care and handout.  Discussed the importance of having soft smooth regular bowel movements daily.  Advised to increase fiber in diet, can purchase over-the-counter Metamucil.  MiraLAX prescribed to use as needed.

## 2023-02-05 ENCOUNTER — Other Ambulatory Visit (HOSPITAL_COMMUNITY): Payer: Self-pay

## 2023-02-05 ENCOUNTER — Ambulatory Visit: Payer: Medicaid Other | Attending: Family Medicine

## 2023-02-05 DIAGNOSIS — M5459 Other low back pain: Secondary | ICD-10-CM | POA: Diagnosis not present

## 2023-02-05 DIAGNOSIS — M542 Cervicalgia: Secondary | ICD-10-CM | POA: Diagnosis not present

## 2023-02-05 DIAGNOSIS — M6281 Muscle weakness (generalized): Secondary | ICD-10-CM | POA: Insufficient documentation
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
## 2023-02-13 ENCOUNTER — Ambulatory Visit: Payer: Medicaid Other | Admitting: Student

## 2023-02-13 ENCOUNTER — Encounter: Payer: Self-pay | Admitting: Student

## 2023-02-13 VITALS — BP 107/49 | HR 75 | Ht 65.0 in | Wt 125.8 lb

## 2023-02-13 DIAGNOSIS — K649 Unspecified hemorrhoids: Secondary | ICD-10-CM

## 2023-02-13 DIAGNOSIS — R4589 Other symptoms and signs involving emotional state: Secondary | ICD-10-CM | POA: Diagnosis not present

## 2023-02-13 DIAGNOSIS — F419 Anxiety disorder, unspecified: Secondary | ICD-10-CM | POA: Diagnosis not present

## 2023-02-13 HISTORY — DX: Anxiety disorder, unspecified: F41.9

## 2023-02-13 MED ORDER — ESCITALOPRAM OXALATE 20 MG PO TABS
20.0000 mg | ORAL_TABLET | Freq: Every day | ORAL | 2 refills | Status: AC
Start: 2023-02-13 — End: ?

## 2023-02-13 MED ORDER — HYDROCORTISONE ACETATE 25 MG RE SUPP: 25.0000 mg | Freq: Three times a day (TID) | RECTAL | 3 refills | Status: DC

## 2023-02-13 NOTE — Assessment & Plan Note (Addendum)
Improving on Lexapro 20 mg. PHQ9 improving.  Still room to improve with controlling PTSD symptoms - hopefully psychiatry can help with medication adjustments later this month. Provided letter for ESA.

## 2023-02-13 NOTE — Patient Instructions (Addendum)
It was great to see you today!   Today we addressed: Anxiety : refilled Lexapro  Future Appointments  Date Time Provider Department Center  02/13/2023  9:20 AM Glendale Chard, DO [facility]  02/15/2023  2:30 PM Gus Puma J [facility] None  02/19/2023  3:15 PM [name], LCSW [facility] None  02/23/2023  9:00 AM Park Pope, MD [facility] None  03/15/2023 10:00 AM [place], Neena Rhymes, LCSW [facility] None    Please arrive 15 minutes before your appointment to ensure smooth check in process.    Please call the clinic at [phone] if your symptoms worsen or you have any concerns.  Thank you for allowing me to participate in your care, Dr. Glendale Chard [facility]

## 2023-02-13 NOTE — Progress Notes (Signed)
    SUBJECTIVE:   CHIEF COMPLAINT / HPI:   Helen Strickland is a 27 y.o. female  presenting for follow up for her mental health and ESA.   Anxiety: She reports doing much better on the Lexapro 20 mg but still has difficult days. She has an appointment with psychiatry scheduled for later this month. She had an emotional support animal (cat) in the past but the registration ran out. She is requesting a renewal of her paperwork at this visit.  PERTINENT  PMH / PSH: Reviewed and updated   OBJECTIVE:   BP (!) 107/49   Pulse 75   Ht 5\' 5"  (1.651 m)   Wt 125 lb 12.8 oz (57.1 kg)   SpO2 100%   BMI 20.93 kg/m   Well-appearing, no acute distress Cardio: Regular rate, Regular rhythm, no murmurs on exam. Pulm: Clear, no wheezing, no crackles. No increased work of breathing Abdominal: bowel sounds present, soft, non-tender, non-distended Extremities: no peripheral edema  Neuro: alert and oriented x3, speech normal in content, no facial asymmetry, strength intact and equal bilaterally in UE and LE, pupils equal and reactive to light.  Psych:  Cognition and judgment appear intact. Alert, communicative  and cooperative with normal attention span and concentration. No apparent delusions, illusions, hallucinations      02/13/2023    9:23 AM 02/02/2023    4:19 PM 12/19/2022    2:09 PM  PHQ9 SCORE ONLY  PHQ-9 Total Score 8 3 9       ASSESSMENT/PLAN:   Anxiety Improving on Lexapro 20 mg. PHQ9 improving.  Still room to improve with controlling PTSD symptoms - hopefully psychiatry can help with medication adjustments later this month. Provided letter for ESA.      Glendale Chard, DO [facility]

## 2023-02-15 ENCOUNTER — Other Ambulatory Visit: Payer: Medicaid Other

## 2023-02-15 ENCOUNTER — Ambulatory Visit: Payer: Medicaid Other | Admitting: Licensed Clinical Social Worker

## 2023-02-15 NOTE — Patient Instructions (Signed)
Visit Information  Ms. Helen Strickland was given information about Medicaid Managed Care team care coordination services as a part of their Healthy [facility] Medicaid benefit. Helen Strickland verbally consented to engagement with the [facility] Managed Care team.   If you are experiencing a medical emergency, please call 911 or report to your local emergency department or urgent care.   If you have a non-emergency medical problem during routine business hours, please contact your provider's office and ask to speak with a nurse.   For questions related to your Healthy [facility] health plan, please call: [phone] or visit the homepage here: [email or web address]  If you would like to schedule transportation through your Healthy [facility] plan, please call the following number at least 2 days in advance of your appointment: [phone]  For information about your ride after you set it up, call Ride Assist at [phone]. Use this number to activate a Will Call pickup, or if your transportation is late for a scheduled pickup. Use this number, too, if you need to make a change or cancel a previously scheduled reservation.  If you need transportation services right away, call [phone]. The after-hours call center is staffed 24 hours to handle ride assistance and urgent reservation requests (including discharges) 365 days a year. Urgent trips include sick visits, hospital discharge requests and life-sustaining treatment.  Call the [facility] Line at [phone], at any time, 24 hours a day, 7 days a week. If you are in danger or need immediate medical attention call 911.  If you would like help to quit smoking, call 1-800-QUIT-NOW (416-142-9513) OR Espaol: 1-855-Djelo-Ya (3-016-010-9323) o para ms informacin haga clic aqu or Text READY to 557-322 to register via text  Helen Strickland - following are the goals we discussed in your visit today:    Goals Addressed   None      Social Worker will follow up on 03/20/23.   Helen Strickland, Helen Strickland, MHA [facility] Health  Managed Medicaid Social Worker [phone]   Following is a copy of your plan of care:  There are no care plans that you recently modified to display for this patient.
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
## 2023-02-19 ENCOUNTER — Other Ambulatory Visit: Payer: Medicaid Other | Admitting: Licensed Clinical Social Worker
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
## 2023-02-23 ENCOUNTER — Ambulatory Visit (HOSPITAL_COMMUNITY): Payer: Medicaid Other | Admitting: Student

## 2023-02-27 ENCOUNTER — Other Ambulatory Visit: Payer: Medicaid Other | Admitting: Licensed Clinical Social Worker
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
## 2023-03-13 ENCOUNTER — Ambulatory Visit (HOSPITAL_COMMUNITY): Payer: Medicaid Other | Admitting: Clinical

## 2023-03-13 ENCOUNTER — Encounter (HOSPITAL_COMMUNITY): Payer: Self-pay

## 2023-03-13 ENCOUNTER — Telehealth (HOSPITAL_COMMUNITY): Payer: Self-pay | Admitting: Clinical

## 2023-03-13 NOTE — Telephone Encounter (Signed)
Therapist sent the client a link for the scheduled appointment. Client did not check in using the link. Therapist attempted to call the client. Phone number stating it was inactive. Therapist unable to leave a voicemail.

## 2023-03-15 ENCOUNTER — Ambulatory Visit (HOSPITAL_COMMUNITY): Payer: Medicaid Other | Admitting: Clinical

## 2023-03-20 ENCOUNTER — Other Ambulatory Visit: Payer: Medicaid Other

## 2023-03-20 NOTE — Patient Instructions (Signed)
Visit Information  Ms. Hamre was given information about Medicaid Managed Care team care coordination services as a part of their Healthy [facility] Medicaid benefit. Dalal Hatem verbally consented to engagement with the [facility] Managed Care team.   If you are experiencing a medical emergency, please call 911 or report to your local emergency department or urgent care.   If you have a non-emergency medical problem during routine business hours, please contact your provider's office and ask to speak with a nurse.   For questions related to your Healthy [facility] health plan, please call: [phone] or visit the homepage here: [email or web address]  If you would like to schedule transportation through your Healthy [facility] plan, please call the following number at least 2 days in advance of your appointment: [phone]  For information about your ride after you set it up, call Ride Assist at [phone]. Use this number to activate a Will Call pickup, or if your transportation is late for a scheduled pickup. Use this number, too, if you need to make a change or cancel a previously scheduled reservation.  If you need transportation services right away, call [phone]. The after-hours call center is staffed 24 hours to handle ride assistance and urgent reservation requests (including discharges) 365 days a year. Urgent trips include sick visits, hospital discharge requests and life-sustaining treatment.  Call the [facility] Line at [phone], at any time, 24 hours a day, 7 days a week. If you are in danger or need immediate medical attention call 911.  If you would like help to quit smoking, call 1-800-QUIT-NOW (214-151-6604) OR Espaol: 1-855-Djelo-Ya (8-756-433-2951) o para ms informacin haga clic aqu or Text READY to 884-166 to register via text  Ms. Plouff - following are the goals we discussed in your visit today:    Goals Addressed   None     Social Worker will follow up on 04/19/23.   Gus Puma, Kenard Gower, MHA [facility] Health  Managed Medicaid Social Worker [phone]   Following is a copy of your plan of care:  There are no care plans that you recently modified to display for this patient.
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
## 2023-03-30 ENCOUNTER — Other Ambulatory Visit: Payer: Medicaid Other | Admitting: Licensed Clinical Social Worker
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
## 2023-04-13 ENCOUNTER — Encounter (HOSPITAL_COMMUNITY): Payer: Self-pay

## 2023-04-16 ENCOUNTER — Other Ambulatory Visit: Payer: Medicaid Other | Admitting: Licensed Clinical Social Worker

## 2023-04-16 NOTE — Patient Outreach (Incomplete)
{  MANAGED MEDICAID [id number]}

## 2023-04-17 ENCOUNTER — Ambulatory Visit (HOSPITAL_COMMUNITY): Payer: Medicaid Other | Admitting: Student

## 2023-04-17 NOTE — Progress Notes (Unsigned)
    SUBJECTIVE:   CHIEF COMPLAINT / HPI: Forms  Patient comes in today to get forms signed for assistance due to being out of work due to her scoliosis.  She reports that she has to proceed with her activities throughout the day with her toddler even through the pain.  She reports that she can only function about 30 minutes - 1 hour in ADLs such as walking, sitting, bending, standing before the pain starts.  However with her toddler she is expected to continue going throughout the day and does.  Is not in any acute distress at this time.  She reports that she has not been working due to her condition.  PERTINENT  PMH / PSH: anxiety, PTSD  OBJECTIVE:   BP 93/82   Pulse 97   Ht 5\' 5"  (1.651 m)   Wt 133 lb 4 oz (60.4 kg)   LMP 03/17/2023   SpO2 100%   BMI 22.17 kg/m   General: Awake and Alert in NAD HEENT: Normocephalic, atraumatic. Conjunctiva normal. No nasal discharge Cardiovascular: RRR. No M/R/G Respiratory: CTAB, normal WOB on RA. No wheezing, crackles, rhonchi, or diminished breath sounds. Abdomen: Soft, non-tender, non-distended. Bowel sounds normoactive Extremities: No BLE edema.  No tenderness to palpation over thoracic and lumbar spine.  Right-sided fullness over the thoracic spine. Skin: Warm and dry.  ASSESSMENT/PLAN:   Scoliosis Patient has been dealing with scoliosis since 2020. Seems to have no discomfort in the room today when handling her toddler.  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  She has tried PT in the past, but reports that this has not improved her pain.  Currently is not working and would like a form to be completed for assistance/reassessment of her abilities to work/need for vocational rehab.  The form was completed today based on the patient's presentation and her reported actions were taken into consideration.  Form can be found in her chart.   [name], DO [place] [facility]

## 2023-04-17 NOTE — Patient Instructions (Signed)
Visit Information  Helen Strickland was given information about Medicaid Managed Care team care coordination services as a part of their Healthy [facility] Medicaid benefit. Helen Strickland verbally consented to engagement with the [facility] Managed Care team.   If you are experiencing a medical emergency, please call 911 or report to your local emergency department or urgent care.   If you have a non-emergency medical problem during routine business hours, please contact your provider's office and ask to speak with a nurse.   For questions related to your Healthy [facility] health plan, please call: [phone] or visit the homepage here: [email or web address]  If you would like to schedule transportation through your Healthy [facility] plan, please call the following number at least 2 days in advance of your appointment: [phone]  For information about your ride after you set it up, call Ride Assist at [phone]. Use this number to activate a Will Call pickup, or if your transportation is late for a scheduled pickup. Use this number, too, if you need to make a change or cancel a previously scheduled reservation.  If you need transportation services right away, call [phone]. The after-hours call center is staffed 24 hours to handle ride assistance and urgent reservation requests (including discharges) 365 days a year. Urgent trips include sick visits, hospital discharge requests and life-sustaining treatment.  Call the [facility] Line at [phone], at any time, 24 hours a day, 7 days a week. If you are in danger or need immediate medical attention call 911.  If you would like help to quit smoking, call 1-800-QUIT-NOW (407-039-2524) OR Espaol: 1-855-Djelo-Ya (7-564-332-9518) o para ms informacin haga clic aqu or Text READY to 841-660 to register via text  Dickie La, BSW, MSW, [employer] Managed Medicaid LCSW [facility]  [facility] [email or web address] Phone: [phone]

## 2023-04-18 ENCOUNTER — Encounter: Payer: Self-pay | Admitting: Family Medicine

## 2023-04-18 ENCOUNTER — Ambulatory Visit (INDEPENDENT_AMBULATORY_CARE_PROVIDER_SITE_OTHER): Payer: Medicaid Other | Admitting: Family Medicine

## 2023-04-18 ENCOUNTER — Ambulatory Visit (HOSPITAL_COMMUNITY): Payer: Medicaid Other | Admitting: Student

## 2023-04-18 VITALS — BP 93/82 | HR 97 | Ht 65.0 in | Wt 133.2 lb

## 2023-04-18 DIAGNOSIS — M419 Scoliosis, unspecified: Secondary | ICD-10-CM

## 2023-04-18 NOTE — Assessment & Plan Note (Addendum)
Patient has been dealing with scoliosis since 2020. Seems to have no discomfort in the room today when handling her toddler.  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  She has tried PT in the past, but reports that this has not improved her pain.  Currently is not working and would like a form to be completed for assistance/reassessment of her abilities to work/need for vocational rehab.  The form was completed today based on the patient's presentation and her reported actions were taken into consideration.  Form can be found in her chart.

## 2023-04-18 NOTE — Patient Instructions (Signed)
It was great to see you today! Thank you for choosing [facility] for your primary care. Helen Strickland was seen for form completion.  Today we addressed: Scoliosis and ability to work through the pain with toddler. Form was completed.  You should return to our clinic No follow-ups on file. Please arrive 15 minutes before your appointment to ensure smooth check in process.  We appreciate your efforts in making this happen.  Thank you for allowing me to participate in your care, [name], DO 04/18/2023, 9:28 AM PGY-1, [facility]

## 2023-04-19 ENCOUNTER — Ambulatory Visit: Payer: Medicaid Other | Admitting: Family Medicine

## 2023-04-19 ENCOUNTER — Telehealth: Payer: Self-pay

## 2023-04-19 ENCOUNTER — Encounter: Payer: Self-pay | Admitting: Family Medicine

## 2023-04-19 ENCOUNTER — Other Ambulatory Visit: Payer: Medicaid Other

## 2023-04-19 VITALS — BP 117/61 | HR 91 | Ht 65.0 in | Wt 133.4 lb

## 2023-04-19 DIAGNOSIS — M4126 Other idiopathic scoliosis, lumbar region: Secondary | ICD-10-CM

## 2023-04-19 NOTE — Assessment & Plan Note (Signed)
Completed form based on assessment done on 10/9 by Dr. Fatima Blank with corrections made as appropriate.

## 2023-04-19 NOTE — Telephone Encounter (Addendum)
Patient calls nurse line in response to form not being filled out correctly at today's visit.   Patient reports that section E that indicates that number of hours per day she can perform work and training activities is incorrect.   She states that she discussed with Dr. Fatima Blank at visit yesterday that the 6 hours per day was to be split up over the various tasks. She states that each individual line needs to indicate one hour.   I spoke with Dr. Hyacinth Meeker who saw patient today. She states that based off of patient's physical assessment from yesterday that these changes would need additional appointment.  Scheduled patient for tomorrow morning with Dr. Sherrilee Gilles.   Veronda Prude, RN

## 2023-04-19 NOTE — Progress Notes (Addendum)
    SUBJECTIVE:   CHIEF COMPLAINT / HPI:   Patient presented for form completion. She was seen yesterday by Dr. Fatima Blank, who pleated a thorough evaluation for her temporary disability paperwork.  She returns today because there was a small mistake on her paperwork which necessitated recompletion.  She has no other complaints or concerns  PERTINENT  PMH / PSH: Scoliosis  OBJECTIVE:   BP 117/61   Pulse 91   Ht 5\' 5"  (1.651 m)   Wt 133 lb 6.4 oz (60.5 kg)   LMP 03/17/2023   SpO2 100%   BMI 22.20 kg/m   Not completed  ASSESSMENT/PLAN:   Scoliosis Completed form based on assessment done on 10/9 by Dr. Fatima Blank with corrections made as appropriate.  Please see phone note for further details.   Gerrit Heck, DO [facility]

## 2023-04-19 NOTE — Patient Instructions (Signed)
  Medicaid Managed Care   Unsuccessful Outreach Note  04/19/2023 Name: Helen Strickland MRN: [id number] DOB: [birth year]  Referred by: Glendale Chard, DO Reason for referral : High Risk Managed Medicaid (MM Social work unsuccessful telephone outreach )   An unsuccessful telephone outreach was attempted today. The patient was referred to the case management team for assistance with care management and care coordination.   Follow Up Plan: The patient has been provided with contact information for the care management team and has been advised to call with any health related questions or concerns.   Abelino Derrick, MHA [facility]  Managed [facility] Social Worker [phone]

## 2023-04-19 NOTE — Patient Outreach (Signed)
  Medicaid Managed Care   Unsuccessful Outreach Note  04/19/2023 Name: Helen Strickland MRN: [id number] DOB: [birth year]  Referred by: Glendale Chard, DO Reason for referral : High Risk Managed Medicaid (MM Social work unsuccessful telephone outreach )   An unsuccessful telephone outreach was attempted today. The patient was referred to the case management team for assistance with care management and care coordination.   Follow Up Plan: The patient has been provided with contact information for the care management team and has been advised to call with any health related questions or concerns.   Abelino Derrick, MHA [facility]  Managed [facility] Social Worker [phone]

## 2023-04-19 NOTE — Telephone Encounter (Signed)
Patient called with Dr. Pollie Meyer. After confirming patient identity, we discussed the purpose of the form and the necessary corrections. The form is to apply for [employer], and she needs it to list the limitations she has specifically with each action listed on the form. After discussion with the patient, the form was completed, and a copy can be found in the media tab of the chart. The patient was instructed to pick up the completed form in the morning.

## 2023-04-20 ENCOUNTER — Ambulatory Visit: Payer: Medicaid Other

## 2023-04-26 ENCOUNTER — Ambulatory Visit (INDEPENDENT_AMBULATORY_CARE_PROVIDER_SITE_OTHER): Payer: Medicaid Other | Admitting: Clinical

## 2023-04-26 DIAGNOSIS — F1210 Cannabis abuse, uncomplicated: Secondary | ICD-10-CM

## 2023-04-26 DIAGNOSIS — F4310 Post-traumatic stress disorder, unspecified: Secondary | ICD-10-CM | POA: Diagnosis not present
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
## 2023-05-11 ENCOUNTER — Other Ambulatory Visit: Payer: Self-pay

## 2023-05-11 NOTE — Patient Instructions (Signed)
Visit Information  Ms. Helen Strickland was given information about Medicaid Managed Care team care coordination services as a part of their Healthy [facility] Medicaid benefit. Helen Strickland verbally consented to engagement with the [facility] Managed Care team.   If you are experiencing a medical emergency, please call 911 or report to your local emergency department or urgent care.   If you have a non-emergency medical problem during routine business hours, please contact your provider's office and ask to speak with a nurse.   For questions related to your Healthy [facility] health plan, please call: [phone] or visit the homepage here: [email or web address]  If you would like to schedule transportation through your Healthy [facility] plan, please call the following number at least 2 days in advance of your appointment: [phone]  For information about your ride after you set it up, call Ride Assist at [phone]. Use this number to activate a Will Call pickup, or if your transportation is late for a scheduled pickup. Use this number, too, if you need to make a change or cancel a previously scheduled reservation.  If you need transportation services right away, call [phone]. The after-hours call center is staffed 24 hours to handle ride assistance and urgent reservation requests (including discharges) 365 days a year. Urgent trips include sick visits, hospital discharge requests and life-sustaining treatment.  Call the [facility] Line at [phone], at any time, 24 hours a day, 7 days a week. If you are in danger or need immediate medical attention call 911.  If you would like help to quit smoking, call 1-800-QUIT-NOW (731-677-3881) OR Espaol: 1-855-Djelo-Ya (8-756-433-2951) o para ms informacin haga clic aqu or Text READY to 884-166 to register via text  Helen Strickland - following are the goals we discussed in your visit today:    Goals Addressed   None      The  Patient                                              has been provided with contact information for the Managed Medicaid care management team and has been advised to call with any health related questions or concerns.   Helen Strickland, Helen Strickland, MHA [facility]  Managed Medicaid Social Worker [phone]   Following is a copy of your plan of care:  There are no care plans that you recently modified to display for this patient.
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
## 2023-05-17 ENCOUNTER — Other Ambulatory Visit: Payer: Self-pay | Admitting: Licensed Clinical Social Worker
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
## 2023-05-24 ENCOUNTER — Ambulatory Visit (HOSPITAL_COMMUNITY): Payer: Medicaid Other | Admitting: Student

## 2023-06-14 ENCOUNTER — Other Ambulatory Visit: Payer: Self-pay | Admitting: Licensed Clinical Social Worker

## 2023-06-14 NOTE — Patient Instructions (Signed)
[name] ,   The [facility] Managed Care Team is available to provide assistance to you with your healthcare needs at no cost and as a benefit of your [facility] Health plan. We have been unable to reach you on 3 separate attempts. The care management team is available to assist with your healthcare needs at any time. Please do not hesitate to contact me at the number below. .   Thank you,   Dickie La, BSW, MSW, LCSW Licensed Clinical Social Worker [employer]   [facility] [email or web address] Direct Dial: [phone]

## 2023-06-14 NOTE — Patient Outreach (Signed)
  Medicaid Managed Care   Unsuccessful Attempt Note   06/14/2023 Name: Helen Strickland MRN: [id number] DOB: Jul 19, 1995  Referred by: Glendale Chard, DO Reason for referral : No chief complaint on file.   Third unsuccessful telephone outreach was attempted today. The patient was referred to the case management team for assistance with care management and care coordination. The patient's primary care provider has been notified of our unsuccessful attempts to make or maintain contact with the patient. The care management team is pleased to engage with this patient at any time in the future should he/she be interested in assistance from the care management team.    Follow Up Plan: A HIPAA compliant phone message was unable to be left for the patient as number is not available but letter was mailed to patient successfully regarding missed this call.   Dickie La, BSW, MSW, LCSW Licensed Clinical Social Worker [employer]   [facility] [email or web address] Direct Dial: [phone]

## 2023-06-25 ENCOUNTER — Telehealth: Payer: Self-pay

## 2023-06-25 NOTE — Telephone Encounter (Signed)
Patient calls nurse line requesting her last office visit.   She reports she needs this printed and stamped with our office stamp.  She reports her license is expired and therefore she needs a medical record to prove identity.   Last office visit note printed and each page stamped.  Left up front for patient.

## 2023-08-05 ENCOUNTER — Other Ambulatory Visit: Payer: Self-pay

## 2023-08-05 ENCOUNTER — Telehealth (HOSPITAL_COMMUNITY): Payer: Self-pay | Admitting: Emergency Medicine

## 2023-08-05 ENCOUNTER — Ambulatory Visit (HOSPITAL_COMMUNITY)
Admission: EM | Admit: 2023-08-05 | Discharge: 2023-08-05 | Disposition: A | Discharge status: Home / Self Care | Payer: Medicaid Other | Attending: Physician Assistant | Admitting: Physician Assistant

## 2023-08-05 ENCOUNTER — Encounter (HOSPITAL_COMMUNITY): Payer: Self-pay | Admitting: Emergency Medicine

## 2023-08-05 DIAGNOSIS — Z202 Contact with and (suspected) exposure to infections with a predominantly sexual mode of transmission: Secondary | ICD-10-CM | POA: Insufficient documentation

## 2023-08-05 DIAGNOSIS — N898 Other specified noninflammatory disorders of vagina: Secondary | ICD-10-CM | POA: Insufficient documentation

## 2023-08-05 LAB — HIV ANTIBODY (ROUTINE TESTING W REFLEX): HIV Screen 4th Generation wRfx: NONREACTIVE

## 2023-08-05 MED ORDER — DOXYCYCLINE HYCLATE 100 MG PO CAPS: 100.0000 mg | ORAL_CAPSULE | Freq: Two times a day (BID) | ORAL | 0 refills | Status: DC

## 2023-08-05 MED ORDER — CEFTRIAXONE SODIUM 500 MG IJ SOLR
INTRAMUSCULAR | Status: AC
  Filled 2023-08-05: qty 500

## 2023-08-05 MED ORDER — LIDOCAINE HCL (PF) 1 % IJ SOLN
INTRAMUSCULAR | Status: AC
  Filled 2023-08-05: qty 2

## 2023-08-05 MED ORDER — CEFTRIAXONE SODIUM 500 MG IJ SOLR
500.0000 mg | INTRAMUSCULAR | Status: DC
  Administered 2023-08-05: 500 mg via INTRAMUSCULAR

## 2023-08-05 MED ORDER — DOXYCYCLINE HYCLATE 100 MG PO CAPS: 100.0000 mg | ORAL_CAPSULE | Freq: Two times a day (BID) | ORAL | 0 refills | Status: AC

## 2023-08-05 NOTE — ED Triage Notes (Signed)
Patient reports having concerns for std.  Rash initially and now irritated.  Symptoms for 2 weeks
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
## 2023-08-05 NOTE — Discharge Instructions (Addendum)
Take antibiotic as prescribed Will call with test results and change treatment plan if indicated.  Abstain from sexual activity until test results are back and all recommended treatment is completed.

## 2023-08-05 NOTE — ED Notes (Signed)
Changed pharmacy for patient

## 2023-08-06 LAB — CERVICOVAGINAL ANCILLARY ONLY
Bacterial Vaginitis (gardnerella): POSITIVE — AB
Candida Glabrata: NEGATIVE
Candida Vaginitis: POSITIVE — AB
Chlamydia: NEGATIVE
Comment: NEGATIVE
Comment: NEGATIVE
Comment: NEGATIVE
Comment: NEGATIVE
Comment: NEGATIVE
Comment: NORMAL
Neisseria Gonorrhea: NEGATIVE
Trichomonas: NEGATIVE

## 2023-08-06 LAB — RPR: RPR Ser Ql: NONREACTIVE

## 2023-08-07 ENCOUNTER — Telehealth: Payer: Self-pay

## 2023-08-07 MED ORDER — METRONIDAZOLE 500 MG PO TABS: 500.0000 mg | ORAL_TABLET | Freq: Two times a day (BID) | ORAL | 0 refills | Status: AC

## 2023-08-07 MED ORDER — FLUCONAZOLE 150 MG PO TABS: 150.0000 mg | ORAL_TABLET | Freq: Once | ORAL | 0 refills | Status: AC

## 2023-08-07 NOTE — Telephone Encounter (Signed)
Per protocol, pt requires tx with Diflucan and metronidazole. Rx sent to pharmacy on file.

## 2023-11-30 ENCOUNTER — Ambulatory Visit: Admitting: Student

## 2024-01-23 ENCOUNTER — Ambulatory Visit (HOSPITAL_COMMUNITY): Admission: EM | Admit: 2024-01-23 | Discharge: 2024-01-23 | Disposition: A | Discharge status: Home / Self Care

## 2024-01-23 DIAGNOSIS — F411 Generalized anxiety disorder: Secondary | ICD-10-CM | POA: Insufficient documentation

## 2024-01-23 DIAGNOSIS — F1290 Cannabis use, unspecified, uncomplicated: Secondary | ICD-10-CM | POA: Insufficient documentation

## 2024-01-23 DIAGNOSIS — F4310 Post-traumatic stress disorder, unspecified: Secondary | ICD-10-CM | POA: Insufficient documentation

## 2024-01-23 DIAGNOSIS — F329 Major depressive disorder, single episode, unspecified: Secondary | ICD-10-CM | POA: Insufficient documentation

## 2024-01-23 NOTE — Progress Notes (Signed)
   01/23/24 1649  [place] Suicide Severity Rating Scale  1. In the past month -  Have you wished you were dead or wished you could go to sleep and not wake up? No  2. In the past month - Have you actually had any thoughts of killing yourself? No  6. Have you ever done anything, started to do anything, or prepared to do anything to end your life? No  C-SSRS RISK CATEGORY No Risk

## 2024-01-23 NOTE — Discharge Instructions (Addendum)
 Discharge recommendations:   Medications: Patient is to take medications as prescribed. The patient or patient's guardian is to contact a medical professional and/or outpatient provider to address any new side effects that develop. The patient or the patient's guardian should update outpatient providers of any new medications and/or medication changes.    Outpatient Follow up: Please review list of outpatient resources for psychiatry and counseling. Please follow up with your primary care provider for all medical related needs.    Therapy: We recommend that patient participate in individual therapy to address mental health concerns.  Safety:   The following safety precautions should be taken:   No sharp objects. This includes scissors, razors, scrapers, and putty knives.   Chemicals should be removed and locked up.   Medications should be removed and locked up.   Weapons should be removed and locked up. This includes firearms, knives and instruments that can be used to cause injury.   The patient should abstain from use of illicit substances/drugs and abuse of any medications.  If symptoms worsen or do not continue to improve or if the patient becomes actively suicidal or homicidal then it is recommended that the patient return to the closest hospital emergency department, the [facility], or call 911 for further evaluation and treatment. National Suicide Prevention Lifeline 1-800-SUICIDE or (714)277-8374.  About 988 988 offers 24/7 access to trained crisis counselors who can help people experiencing mental health-related distress. People can call or text 988 or chat 988lifeline.org for themselves or if they are worried about a loved one who may need crisis support.            Same-day appointments Operating hours and clinician availability may delay appointments until the next business day. [place]  New and Current Patients  Psychiatry  hours Monday-Friday, 8am-4:30pm (Eastern Time) If you are experiencing problems accessing [facility] send email to: [email or web address] or call [phone], Monday-Friday, 8:00am-4:30pm If you are having a psychiatric or medical emergency, please call 911 or go to the nearest emergency department. To reach the Suicide and Crisis Lifeline, please call or text 988.  What is [facility]?  [facility] is an online service that provides same-day access to psychiatry to meet urgent mental health needs. The goal is to provide patients with timely intervention and keep them in an outpatient setting.  How long will I wait to see a clinician?  Our goal is to provide same-day care. However, operating hours and clinician availability may delay appointments until the next business day.  What if I can't get an appointment?  Please call [facility] at [phone] 8am to 5pm [place] Time or email us :  [email or web address]  to request the next available time. If you are having a psychiatric or medical emergency, please call 911 or go to the nearest emergency department. To reach the Suicide and Crisis Lifeline, please call or text 988. Do you accept insurance?  Yes. We accept most commercial insurance plans. What information do you need from me? New patients should be ready to provide:  Photo ID  Insurance card  Payment information  For current patients, our specialists will confirm your documentation is on file.   Can I continue with regular care after my [facility] session?  Yes. Our goal is to make sure you have the follow-up care you need. Our specialist can help you schedule an appointment with an ongoing provider in addition to your On Demand session.  Mi  Can my current clinician provide treatment on [facility]?  No. Our [facility] clinicians are trained to assist with more immediate needs and provide support between regular appointments  with your clinician.  What device can I use to connect with [facility]?  You can use any Wi-Fi-enabled device with a camera and a microphone, such as a smartphone, tablet, or computer.  Do I have to be at home to connect with [facility]?  No. As long as you are located within [place]  or [place]  you can connect with a provider. We do request that you connect from a safe and private location. Your clinician is required to document your location for emergency purposes.               Domestic Violence Crisis   [facility] -- [phone] [place] -- [phone] [facility] shelter program provides temporary emergency shelter to individuals and families escaping violent homes. Family Service manages two shelters, [facility] in [place] and [address] in [place]. More than just a safe place to stay, our shelters provide a stable, supportive environment in which victims and their families can begin to rebuild their lives. Services offered at the shelters include: Crisis Line: [facility] [place] operates a 24-hour crisis line for victims of domestic violence, sexual assault, rape or other violent crimes. Counseling Services: Each resident of our shelter is offered the opportunity to meet with a therapist to provide individual counseling or group therapy. Child play-therapy groups and parenting classes are also available. Court Advocacy: Our large staff of victim advocates provide clients with help attaining restraining orders (50Bs), going to court and legal referrals. Case Management: Case managers are available to meet routinely with shelter residents to set goals and meet identified needs. These may include qualifying for food stamps, finding a job, applying for public housing and locating childcare. Continuation of Care: When a person prepares to leave the shelter, staff can help  them find housing and donated furniture, and prepare for life on their own. Once a family leaves the shelter, they are welcome to return to [facility] for continued individual or group counseling.   [facility]:   Phone: [phone]  [address] [place], [place] [address]  info@[facility] .vickey [phone] [place] The mission of [facility] is to provide a safe haven for homeless women (ages 62 and older without dependents).  We provide respite and encouragement towards good choices which aid the women in creating a positive and self-sufficient life.      [facility] Address: [address] [place], [place], [place] [address] Hours:  Closed ? Hermina CHANETTA Kitchens Phone: [phone] [facility]) is a "one stop shop" for victims of domestic violence, sexual assault, child abuse, and elder abuse. At each of our [facility] locations, professionals from 15 different disciplines work together to provide consolidated and Biomedical scientist, legal, social, and health services to individuals and families in need. At the Centers, victims of domestic and sexual violence can come to one location to access a wide range of supportive resources, such as talking with a victim advocate, getting assistance with filing a restraining order, planning for their safety, talking to a Hydrographic surveyor, meeting with a professional to discuss civil and criminal legal issues, receiving medical assistance, and gaining information on how to access  shelter and other community resources. The [facility] also is a Theatre stage manager for [employer] and education and works with organizations and volunteers throughout the [place].  If you are in immediate danger, please call 911, or [facility] 24 hour Crisis Hotline at 6616519805.    [facility] [place] Address: [address] [place], [place] [address] Hours:  Closed ? Opens  9AM Mon Phone: [phone]
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
## 2024-01-28 ENCOUNTER — Telehealth: Payer: Self-pay

## 2024-01-28 NOTE — Telephone Encounter (Signed)
 Attempted to contact patient regarding denial of escitalopram  by pharmacy. LVM to call back.  Does not appear patient has been taking this medication per chart review. Will hold off on sending in preferred medication, citalopram, for now.  When patient calls back, please advise that if she is interested in medications for her mood, would recommend office visit to further discuss.

## 2024-02-08 ENCOUNTER — Ambulatory Visit (INDEPENDENT_AMBULATORY_CARE_PROVIDER_SITE_OTHER): Admitting: Family Medicine

## 2024-02-08 ENCOUNTER — Encounter: Payer: Self-pay | Admitting: Family Medicine

## 2024-02-08 VITALS — BP 137/89 | HR 104 | Ht 64.5 in | Wt 130.2 lb

## 2024-02-08 DIAGNOSIS — M549 Dorsalgia, unspecified: Secondary | ICD-10-CM | POA: Diagnosis not present
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
## 2024-02-08 NOTE — Patient Instructions (Signed)
 It was wonderful to see you today.  Please bring ALL of your medications with you to every visit.   Today we talked about:  Given your back pain is so severe and you feel you need further evaluation, I recommend you go to the emergency room to be evaluated. Given your symptoms, you may need further imaging such as an xray or MRI.   Thank you for choosing [facility].   Please call [phone] with any questions about today's appointment.  Please arrive at least 15 minutes prior to your scheduled appointments.   If you had blood work today, I will send you a MyChart message or a letter if results are normal. Otherwise, I will give you a call.   If you had a referral placed, they will call you to set up an appointment. Please give us  a call if you don't hear back in the next 2 weeks.   If you need additional refills before your next appointment, please call your pharmacy first.   Do you need your medications delivered to your home?   We'll send your prescription to the [facility] for delivery.          Address: [address] [place], [place], [place] [address]          Phone: [phone]  Please call the [facility] to speak with a pharmacist and set up your home medication delivery. If you have any questions, feel free to contact us  -- we're happy to help!  Other [facility] that offer affordable prices on both prescriptions and over-the-counter items, as well as convenient services like vaccinations, are  [facility], at [facility]         Address:  [address], [place], [place] [address]         Phone: [phone]  [facility], located in the [facility]        Address: [address], [place], [place] [address]        Phone: [phone]  [facility], at [facility]       Address: [address], [place], [place] [address]        Phone: [phone]  [facility], at [facility]       Address: [address], [address] Floor, [place], [place] [address]       Phone: [phone]  You should follow up in our clinic in No follow-ups on file.  Gloriann Ogren, MD Family Medicine

## 2024-07-15 ENCOUNTER — Encounter (HOSPITAL_COMMUNITY): Payer: Self-pay

## 2024-07-15 ENCOUNTER — Emergency Department (HOSPITAL_COMMUNITY)
Admission: EM | Admit: 2024-07-15 | Discharge: 2024-07-16 | Disposition: A | Discharge status: Other Acute Inpatient Hospital | Attending: Emergency Medicine | Admitting: Emergency Medicine

## 2024-07-15 ENCOUNTER — Inpatient Hospital Stay (HOSPITAL_COMMUNITY): Admission: AD | Admit: 2024-07-15 | Source: Intra-hospital

## 2024-07-15 ENCOUNTER — Other Ambulatory Visit: Payer: Self-pay

## 2024-07-15 DIAGNOSIS — Y900 Blood alcohol level of less than 20 mg/100 ml: Secondary | ICD-10-CM | POA: Insufficient documentation

## 2024-07-15 DIAGNOSIS — Z87891 Personal history of nicotine dependence: Secondary | ICD-10-CM | POA: Insufficient documentation

## 2024-07-15 DIAGNOSIS — F1210 Cannabis abuse, uncomplicated: Secondary | ICD-10-CM | POA: Diagnosis not present

## 2024-07-15 DIAGNOSIS — F22 Delusional disorders: Secondary | ICD-10-CM | POA: Insufficient documentation

## 2024-07-15 DIAGNOSIS — F4310 Post-traumatic stress disorder, unspecified: Secondary | ICD-10-CM | POA: Diagnosis present

## 2024-07-15 DIAGNOSIS — R4182 Altered mental status, unspecified: Secondary | ICD-10-CM | POA: Diagnosis not present

## 2024-07-15 LAB — URINALYSIS, ROUTINE W REFLEX MICROSCOPIC
Bilirubin Urine: NEGATIVE
Glucose, UA: NEGATIVE mg/dL
Hgb urine dipstick: NEGATIVE
Ketones, ur: 80 mg/dL — AB
Nitrite: NEGATIVE
Protein, ur: NEGATIVE mg/dL
Specific Gravity, Urine: 1.023 (ref 1.005–1.030)
pH: 6 (ref 5.0–8.0)

## 2024-07-15 LAB — URINE DRUG SCREEN
Amphetamines: NEGATIVE
Barbiturates: NEGATIVE
Benzodiazepines: NEGATIVE
Cocaine: NEGATIVE
Fentanyl: NEGATIVE
Methadone Scn, Ur: NEGATIVE
Opiates: NEGATIVE
Tetrahydrocannabinol: POSITIVE — AB

## 2024-07-15 LAB — CBC WITH DIFFERENTIAL/PLATELET
Abs Immature Granulocytes: 0.01 K/uL (ref 0.00–0.07)
Basophils Absolute: 0 K/uL (ref 0.0–0.1)
Basophils Relative: 0 %
Eosinophils Absolute: 0 K/uL (ref 0.0–0.5)
Eosinophils Relative: 0 %
HCT: 36.5 % (ref 36.0–46.0)
Hemoglobin: 12.5 g/dL (ref 12.0–15.0)
Immature Granulocytes: 0 %
Lymphocytes Relative: 28 %
Lymphs Abs: 1.4 K/uL (ref 0.7–4.0)
MCH: 30.4 pg (ref 26.0–34.0)
MCHC: 34.2 g/dL (ref 30.0–36.0)
MCV: 88.8 fL (ref 80.0–100.0)
Monocytes Absolute: 0.4 K/uL (ref 0.1–1.0)
Monocytes Relative: 8 %
Neutro Abs: 3.3 K/uL (ref 1.7–7.7)
Neutrophils Relative %: 64 %
Platelets: 247 K/uL (ref 150–400)
RBC: 4.11 MIL/uL (ref 3.87–5.11)
RDW: 14.9 % (ref 11.5–15.5)
WBC: 5.1 K/uL (ref 4.0–10.5)
nRBC: 0 % (ref 0.0–0.2)

## 2024-07-15 LAB — COMPREHENSIVE METABOLIC PANEL WITH GFR
ALT: 5 U/L (ref 0–44)
AST: 14 U/L — ABNORMAL LOW (ref 15–41)
Albumin: 4.7 g/dL (ref 3.5–5.0)
Alkaline Phosphatase: 56 U/L (ref 38–126)
Anion gap: 13 (ref 5–15)
BUN: 11 mg/dL (ref 6–20)
CO2: 22 mmol/L (ref 22–32)
Calcium: 9.9 mg/dL (ref 8.9–10.3)
Chloride: 99 mmol/L (ref 98–111)
Creatinine, Ser: 0.72 mg/dL (ref 0.44–1.00)
GFR, Estimated: >60 mL/min
Glucose, Bld: 102 mg/dL — ABNORMAL HIGH (ref 70–99)
Potassium: 3.7 mmol/L (ref 3.5–5.1)
Sodium: 135 mmol/L (ref 135–145)
Total Bilirubin: 0.7 mg/dL (ref 0.0–1.2)
Total Protein: 8.2 g/dL — ABNORMAL HIGH (ref 6.5–8.1)

## 2024-07-15 LAB — PREGNANCY, URINE: Preg Test, Ur: NEGATIVE

## 2024-07-15 LAB — ETHANOL: Alcohol, Ethyl (B): <15 mg/dL

## 2024-07-15 LAB — HCG, SERUM, QUALITATIVE: Preg, Serum: NEGATIVE

## 2024-07-15 MED ORDER — ZIPRASIDONE MESYLATE 20 MG IM SOLR: 10.0000 mg | Freq: Once | INTRAMUSCULAR | Status: DC

## 2024-07-15 MED ORDER — LORAZEPAM 2 MG/ML IJ SOLN
2.0000 mg | Freq: Four times a day (QID) | INTRAMUSCULAR | Status: DC | PRN
  Administered 2024-07-15: 2 mg via INTRAMUSCULAR
  Filled 2024-07-15: qty 1

## 2024-07-15 MED ORDER — HALOPERIDOL LACTATE 5 MG/ML IJ SOLN
5.0000 mg | Freq: Four times a day (QID) | INTRAMUSCULAR | Status: DC | PRN
  Administered 2024-07-15: 5 mg via INTRAMUSCULAR
  Filled 2024-07-15: qty 1

## 2024-07-15 MED ORDER — OLANZAPINE 5 MG PO TBDP
5.0000 mg | ORAL_TABLET | Freq: Two times a day (BID) | ORAL | Status: DC
  Filled 2024-07-15 (×2): qty 1

## 2024-07-15 MED ORDER — HYDROXYZINE HCL 25 MG PO TABS: 25.0000 mg | ORAL_TABLET | Freq: Three times a day (TID) | ORAL | Status: DC | PRN

## 2024-07-15 NOTE — ED Notes (Signed)
 Helen Strickland ( concerned bystander who drove pt to hospital)  Phone [phone]

## 2024-07-15 NOTE — ED Notes (Signed)
 Helen Strickland, DSS is presently in speaking with pt regarding who can be contacted to care for her 2 children who are presently in room with her.

## 2024-07-15 NOTE — ED Notes (Signed)
 Due to increased agitation the plan of safety for pt and children, Tech Sam will stay with children, pt will be escorted to stretcher in hall and restraints applied for safety followed by IM medication. Pt immediately started to resist and fight with staff. Pt secured with restraints, medicated, and placed in room 25.   At this time DSS will be taking children out of the department along with grandmothers assistance. Other family members have been asked to go to the lobby or leave if they wish.   The removal of the two children was uneventful as they left the department.

## 2024-07-15 NOTE — ED Notes (Signed)
 Pt requested her phone to receive contacts out of it. RN instructed pt that she would not be able to keep her phone. Pt stated she will keep it until she receives her contacts. RN notified security, security retrieved phone.

## 2024-07-15 NOTE — ED Triage Notes (Addendum)
 Says she has been having mid to lower back pain she describes as chronic since childhood.   Arrives with complaint of  a few other problems appears paranoid, and vague with expectations of today's visit.   Support person brings her to hospital saying he has not seen her in 7 years. Saw her at a store around 2am. She was very paranoid. Offered a ride but she refused to give address. Pts kid who is with provided their address but she refused to get out of car when she got there.   She asked to use his phone and text someone but wrote on note pad describing a tracker in her head and hearing voices. Able to talk her into coming into ED.   Hx PTSD

## 2024-07-15 NOTE — Progress Notes (Signed)
 Inpatient Psychiatric Referral  Patient was recommended inpatient per Ellouise Dawn, NP . There are no available beds at [facility], per [facility] . Patient was referred to the following out of network facilities:  Destination  Service Provider Address Phone Fax  [facility]  [address] [place] [address] [phone]  [facility]  [address] [place], [place] [address] [phone]  [facility]  [address] [place] [address] [phone]  [facility]  [address], [place] [address] [phone]  [facility]  [address] [place] [address] [phone]  [facility]  [address] [place], [place] [phone]  [facility]  [address] [place] [address] [phone]  [facility]  [address] [place] [address] [phone]    Situation ongoing, CSW to continue following and update chart as more information becomes available.   Harrie Sofia MSW, [place] 07/15/2024

## 2024-07-15 NOTE — Progress Notes (Addendum)
 Pt has been accepted to [facility]. Bed assignment: 303-1  Pt meets inpatient criteria per Ellouise Dasie PIETY   Attending Physician will be Dr Prentis  Report can be called to: -Adult unit: [phone]  Pt can arrive after (pending discharge, labs, UDS, and  Care Team Notified: [facility] Cherylynn Ernst, RN  Niel Nightingale, MSW, LCSW

## 2024-07-15 NOTE — ED Provider Notes (Signed)
 " [facility] Emergency Department Provider Note MRN:  [id number]  Arrival date & time: 07/15/2024     Chief Complaint   Back Pain and Mental Health Problem   History of Present Illness   Helen Strickland is a 29 y.o. year-old female with a history of PTSD presenting to the ED with chief complaint of mental health problem.  Patient was found at a store at 2 AM with her 2 young children.  An old friend recognized her at the store and engaged during conversation.  He noticed that she was acting odd.  He offered to give them a ride home but patient was unable to provide any address.  Son tried to give an address.  Patient then asked to use the [facility] phone but instead of calling someone she tapped a message into his phone that said that she is being tracked by hearing device or chip in her brain and she is not safe.  [facility] decided to bring her and her family here to the emergency department.  Patient agrees to evaluation by the mental health experts, she is endorsing some chronic low back pain for years related to scoliosis, no significant changes recently, no bowel or bladder dysfunction, no numbness or weakness to the arms or legs.  No other complaints.  Review of Systems  A thorough review of systems was obtained and all systems are negative except as noted in the HPI and PMH.   Patient's Health History    Past Medical History:  Diagnosis Date   Anxiety 02/13/2023   Former smoker    History of marijuana use    not while pregnant-2016     Past Surgical History:  Procedure Laterality Date   NO PAST SURGERIES      Family History  Problem Relation Age of Onset   Anxiety disorder Mother    Depression Mother    Post-traumatic stress disorder Father    Anxiety disorder Sister    Depression Sister    Alcohol abuse Neg Hx     Social History   Socioeconomic History   Marital status: Single    Spouse name: Not on file   Number of children: 1    Years of education: Not on file   Highest education level: Some college, no degree  Occupational History   Not on file  Tobacco Use   Smoking status: Former    Types: Cigarettes   Smokeless tobacco: Never  Vaping Use   Vaping status: Former  Substance and Sexual Activity   Alcohol use: No    Comment: social   Drug use: Not Currently    Types: Marijuana    Comment: last weed was when she was 4 months preg   Sexual activity: Not Currently  Other Topics Concern   Not on file  Social History Narrative   Lives with mother Torrence Branagan, 2 younger brothers(Nyjha and Darrien) and one older sister NyTajha   Social Drivers of Health   Tobacco Use: Medium Risk (07/15/2024)   Patient History    Smoking Tobacco Use: Former    Smokeless Tobacco Use: Never    Passive Exposure: Not on file  Financial Resource Strain: Medium Risk (02/15/2023)   Overall Financial Resource Strain (CARDIA)    Difficulty of Paying Living Expenses: Somewhat hard  Food Insecurity: Patient Declined (11/13/2022)   Hunger Vital Sign    Worried About Running Out of Food in the Last Year: Patient declined    Ran Out  of Food in the Last Year: Patient declined  Transportation Needs: No Transportation Needs (05/17/2023)   PRAPARE - Administrator, Civil Service (Medical): No    Lack of Transportation (Non-Medical): No  Physical Activity: Sufficiently Active (11/13/2022)   Exercise Vital Sign    Days of Exercise per Week: 4 days    Minutes of Exercise per Session: 60 min  Stress: Stress Concern Present (04/16/2023)   [employer] of Occupational Health - Occupational Stress Questionnaire    Feeling of Stress : To some extent  Social Connections: Unknown (11/13/2022)   Social Connection and Isolation Panel    Frequency of Communication with Friends and Family: Three times a week    Frequency of Social Gatherings with Friends and Family: Once a week    Attends Religious Services: Patient declined     Active Member of Clubs or Organizations: No    Attends Banker Meetings: Not on file    Marital Status: Never married  Intimate Partner Violence: Not on file  Depression (PHQ2-9): Low Risk (04/26/2023)   Depression (PHQ2-9)    PHQ-2 Score: 1  Recent Concern: Depression (PHQ2-9) - Medium Risk (04/18/2023)   Depression (PHQ2-9)    PHQ-2 Score: 9  Alcohol Screen: Low Risk (11/13/2022)   Alcohol Screen    Last Alcohol Screening Score (AUDIT): 1  Housing: Medium Risk (01/30/2023)   Housing    Last Housing Risk Score: 1  Utilities: At Risk (02/15/2023)   AHC Utilities    Threatened with loss of utilities: Yes  Health Literacy: Not on file     Physical Exam   Vitals:   07/15/24 0328  BP: (!) 142/86  Pulse: (!) 109  Resp: 14  Temp: 97.9 F (36.6 C)  SpO2: 100%    CONSTITUTIONAL: Well-appearing, NAD NEURO/PSYCH:  Alert and oriented x 3, no focal deficits, odd affect EYES:  eyes equal and reactive ENT/NECK:  no LAD, no JVD CARDIO: Regular rate, well-perfused, normal S1 and S2 PULM:  CTAB no wheezing or rhonchi GI/GU:  non-distended, non-tender MSK/SPINE:  No gross deformities, no edema SKIN:  no rash, atraumatic   *Additional and/or pertinent findings included in MDM below  Diagnostic and Interventional Summary    EKG Interpretation Date/Time:    Ventricular Rate:    PR Interval:    QRS Duration:    QT Interval:    QTC Calculation:   R Axis:      Text Interpretation:         Labs Reviewed  COMPREHENSIVE METABOLIC PANEL WITH GFR  ETHANOL  URINE DRUG SCREEN  CBC WITH DIFFERENTIAL/PLATELET  HCG, SERUM, QUALITATIVE  URINALYSIS, ROUTINE W REFLEX MICROSCOPIC    No orders to display    Medications - No data to display   Procedures  /  Critical Care Procedures  ED Course and Medical Decision Making  Initial Impression and Ddx Concern for psychosis with paranoid delusions, concerned that this is putting her kids at risk.  I personally spoke with the  [facility] to confirm the story.  Plan is for IVC, attempting to contact child protective services.  Patient is currently anxious, difficult situation because she does not want to be away from her children at this time.  Will keep patient calm, have TTS evaluate her.  Awaiting CPS involvement.  Patient does not have midline spinal tenderness, pain is chronic, no red flag symptoms to suggest myelopathy, no indication for imaging at this time, denies recent trauma.  Past medical/surgical  history that increases complexity of ED encounter: PTSD  Interpretation of Diagnostics Labs pending  Patient Reassessment and Ultimate Disposition/Management     I spoke with DSS and child protective services, they will come to assess the situation.  Realistic time of arrival for them would be 9 AM.  Patient has been evaluated by TTS, she is still very hesitant to undergo any medical testing.  She is IVC'ed.  With the goal of not causing is seen in front of her children will try to keep her and her children, until DSS arrives.  Signed out to oncoming provider.  Patient management required discussion with the following services or consulting groups:  Psychiatry/TTS  Complexity of Problems Addressed Acute illness or injury that poses threat of life of bodily function  Additional Data Reviewed and Analyzed Further history obtained from: Further history from spouse/family member  Additional Factors Impacting ED Encounter Risk Consideration of hospitalization  Ozell HERO. Theadore, MD [facility] [email or web address]  Final Clinical Impressions(s) / ED Diagnoses     ICD-10-CM   1. Delusions (HCC)  F22     2. Paranoia (HCC)  F22       ED Discharge Orders     None        Discharge Instructions Discussed with and Provided to Patient:   Discharge Instructions   None      Theadore Ozell HERO, MD 07/15/24 [phone]  "

## 2024-07-15 NOTE — ED Notes (Signed)
 Writer was updated by Marit armin Mae who stated Crystal DSS has left to go to the home to see if mother of pt will take responsibility for the two children while pt is receiving treatment at the hospital and inpatient facility. She will update staff and if mother is not able to meet the needs of the children. DSS will arrange care for them.

## 2024-07-15 NOTE — ED Notes (Signed)
 At present time pts mother is in room with pt and two grandchildren.  Pt is coming to door more frequently, ignoring the child crying. After grandmother arrived child stopped crying. Marit and Chesley are present as well. Waiting for SW to call or return with plan of care for the children.

## 2024-07-15 NOTE — ED Notes (Signed)
"  Patient provided with breakfast tray   "
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
## 2024-07-15 NOTE — ED Provider Notes (Signed)
 Just spoke with Camelia Oto [phone], DSS case worker, who just informed me that she got in contact with patient mother Shianne Zeiser, will be coming to visit patient here in the emergency department, but is NOT allowed to take patient's children until Crystal speaks with the DSS attorney, due to patient mental capacity and not being able to make any decisions at this moment. Crystal will be back at the hospital shortly.

## 2024-07-15 NOTE — ED Notes (Incomplete)
 Helen Strickland is presently meeting with pts mother, the 88 yr olds father, and pts sister to discuss the plan of care for the children. Meanwhile pt is more agitated and will not stay inside of room.

## 2024-07-15 NOTE — ED Notes (Signed)
 Per provider may hold off on collecting lab work at this time. Unable to reach family to pick up children. CPS to arrive 9a. Pt calm and cooperative at this time. Kids given coloring sheets and lights dimmed.

## 2024-07-15 NOTE — ED Provider Notes (Addendum)
 BH team is recommending inpatient treatment - will update dispo to bhuc or bhh when available.  DSS is facilitating care of children in [place] coming to ED.  Plan for inpatient psychiatric treatment. Pt currently alert, calm, no distress.      Bernard Drivers, MD 07/15/24 1245   Pt with agitated, aggressive behavior. De-escalation attempts, reassurance provided.  Geodon  given for symptom improvement.      Bernard Drivers, MD 07/15/24 1344

## 2024-07-16 MED ORDER — POLYETHYLENE GLYCOL 3350 17 G PO PACK: 17.0000 g | PACK | Freq: Every day | ORAL | Status: DC | PRN

## 2024-07-16 MED ORDER — POLYETHYLENE GLYCOL 3350 17 GM/SCOOP PO POWD: 17.0000 g | Freq: Every day | ORAL | Status: DC | PRN

## 2024-07-16 NOTE — ED Provider Notes (Addendum)
" °  [place] EMERGENCY DEPARTMENT AT [facility] Observation Re-evaluation Note  Helen Strickland is a 29 y.o. female, seen on rounds today.  Pt initially presented on 07/15/24 at 0314 to the ED for complaints of  Chief Complaint  Patient presents with   Back Pain   Mental Health Problem  Patient presented after delusions of persecution, children were taken by DSS on arrival to ED given patient's presentation concerning for decompensated mental health. PMHx: Scoliosis, PTSD, anemia Currently, the patient is sleeping quietly in bed.  Physical Exam  BP (!) 141/83 (BP Location: Left Arm)   Pulse 64   Temp 97.8 F (36.6 C) (Oral)   Resp 18   SpO2 99%  Physical Exam General: NAD Lungs: Normal effort Psych: Currently calm  ED Course / MDM  EKG:EKG Interpretation Date/Time:  Tuesday July 15 2024 14:46:57 EST Ventricular Rate:  79 PR Interval:  134 QRS Duration:  85 QT Interval:  407 QTC Calculation: 467 R Axis:   69  Text Interpretation: Sinus rhythm No previous tracing Confirmed by Bernard Drivers ([id number]) on 07/15/2024 3:32:59 PM  I have reviewed the labs performed to date as well as medications administered while in observation.  Recent changes in the last 24 hours include patient was evaluated by psychiatry on 1/6 and was recommended for inpatient mission. Home medications: No pertinent or medication Diet: Reordered by primary team  Plan  Current plan is for awaiting psychiatric placement.  Became available for patient at [facility], EMTALA initiated, part 1 and part 4 completed on arrival of Helen Strickland.   Rogelia Jerilynn RAMAN, MD 07/16/24 9271    Rogelia Jerilynn RAMAN, MD 07/16/24 1358  "

## 2024-07-16 NOTE — ED Notes (Signed)
 Pt refused morning medication

## 2024-07-16 NOTE — Progress Notes (Signed)
 Pt has been accepted to [facility] today (07/16/2024)   Bed assignment: Main campus   Pt meets inpatient criteria per Cathaleen Adam, PMHNP   Attending Physician will be Millie Manners, MD   Report can be called to: [phone] (this is a pager, please leave call-back number when giving report)   Pt can arrive today 07/16/2024   Care Team Notified:  [facility] Cherylynn Ernst, RN, Drena Duster, RN, Cathaleen Adam, PMHNP

## 2024-07-16 NOTE — Progress Notes (Signed)
 Inpatient Psychiatric Referral   Patient was recommended inpatient per Ellouise Dawn, NP . There are no available beds at [facility], per [facility] Cherylynn Ernst, RN . Patient was referred to the following out of network facilities:  Destination  Service Provider Request Status Address Phone Fax  [place]  Pending - Request Sent [address], [place] [address] [phone]  [facility]  Pending - Request Sent [address] [place] [employer] [place] [address] [phone]  [facility]  Pending - Request Sent [address] [place] [address] [phone]  [facility]  Pending - Request Sent [address] [place] [address] [phone]  [facility]  Pending - Request Sent [address] [place] [address] [phone]  [facility]  Pending - Request Sent [address] [place] [phone]  [facility]  Pending - Request Sent [address] [place] [address] [phone]  [facility]  Pending - Request Sent [address] [place] [address] [phone]   Situation ongoing, CSW to continue following and update chart as more information becomes available.

## 2024-07-16 NOTE — ED Notes (Signed)
 Report given to Greig Rana, RN at [facility]. [facility] called for transport, will call with time.

## 2024-07-29 ENCOUNTER — Ambulatory Visit: Admitting: Student

## 2024-07-29 VITALS — BP 125/87 | HR 95 | Wt 126.0 lb

## 2024-07-29 DIAGNOSIS — R413 Other amnesia: Secondary | ICD-10-CM

## 2024-07-29 DIAGNOSIS — F22 Delusional disorders: Secondary | ICD-10-CM

## 2024-07-29 DIAGNOSIS — F4310 Post-traumatic stress disorder, unspecified: Secondary | ICD-10-CM

## 2024-07-29 DIAGNOSIS — M4126 Other idiopathic scoliosis, lumbar region: Secondary | ICD-10-CM

## 2024-07-29 NOTE — Patient Instructions (Addendum)
 It was great to see you today!   I am ordering an MRI brain and referring you to neurology. Please schedule an appointment with neurology once your MRI is back.   I am ordering x-rays of your back to look at your scoliosis. You can get this across [facility] at the following address:  [facility] [address]  [phone] You do not need an appointment.    Start taking your medications as prescribed by the psychiatrist. I am sending a referral to have this for follow up.    Therapy and Counseling Resources Most providers on this list will take Medicaid. Patients with commercial insurance or Medicare should contact their insurance company to get a list of in network providers.  [facility] (takes children) Location 1: [address] [place], [place] [address] Location 2: [address] [place], [place] [address] [phone]   [facility] (spanish speaking therapist available)(habla espanol)(take medicare and medicaid)  [address], [place], [place] [address], [place]  [email or web address] [phone]  [facility] [address] [place], [place] [address] [phone]  [facility]   [address], [address] [place], [place] [address]      [phone]  [facility] (spanish available) [address]  [place], [place] [address] [phone]  [facility] (take [facility] and medicare) [address]  [place], [place] [address]       [phone]     [facility] (virtual only) [phone]  Jan[facility] [address] [place], [place] [phone]  [facility]:  [address] [place] [phone]  [facility]:  [address] [place] [phone]  [facility] (under & uninsured) [address]   [place] [phone]     [email or web address]    [facility] [address]  [place]    [phone]  [facility] -[address]     Phone:  [phone]     [facility]-  [address]  [phone]   [facility] [address]      [place], [place] [phone]  [facility]: [address] [place], [place], [place]  [phone]   [facility] (Spanish therapist) [email or web address]  [address] [place]  [address]   [place] [address]    [phone]    [facility]   [address],  [place], [place]  [phone]   [facility]  [address] [place]  [phone]  [facility]  [address] [place], [place]        [phone]  Open Access/Walk In Clinic under & uninsured  [facility]  [address] [place], [place] [phone] Crisis [phone]  [facility] [address],  (Spanish)   [address] , [place]: [phone]) 8:30 - 12; 1 - 2:30  [facility] [employer],  [address] [place], [place]    [phone]):8:30 - 12; 2 - 3PM  [facility] [employer],  [address]  [place]; [phone]):   Mon - Fri 8 AM - 5 PM  [facility] [address] [place]  MWF 12:30 to 3:00 or call to schedule an appointment  [phone]  Specific Provider options Psychology Today  [email or web address].[email or web address]/us  click on find a therapist  enter your zip code left side and select or tailor a therapist for your specific need.   [facility] Provider Directory [email or web address]  (Medicaid)   Follow all drop down to find a provider  Social Support program [facility] [phone] or photosolver.pl [address], [place], [place] Recovery support and educational   24- Hour  Availability:   [facility]  [address] [place], [place] [phone] Crisis [phone]  [facility] [phone]  [facility]  [phone]   [facility]  [phone] (after hours)  Therapeutic Alternative/Mobile Crisis   [phone]  [place]  National Suicide Hotline  (203)072-0733 MERRILYN)  Call 911 or go to emergency room  [facility]  [phone]);  [place]  [phone]); [place], [place], [place], [place], [place], [place], [place]   No future appointments.  Please arrive 15 minutes before your appointment to ensure smooth check in process.  If you are more than 15 minutes late, you may be asked to reschedule.   Please bring a list of your medications with you to all appointments.   Please call the clinic at [phone] if your symptoms worsen or you have any concerns.  Thank you for allowing me to participate in your care, Dr. Damien Pinal [facility]
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
## 2024-07-30 NOTE — Assessment & Plan Note (Signed)
 Chronic and worsening neuropsychiatric symptoms post-head trauma, suggestive of PTSD v CTE. Current medications include lamotrigine and escitalopram . - Ordered MRI of the brain. - Referred to neurology for further evaluation, mother to schedule appointment after MRI is completed  - Provided therapy resources. - Referred to psychiatry for medication management

## 2024-07-30 NOTE — Assessment & Plan Note (Signed)
 Chronic back pain with scoliosis, exacerbated by physical activity. Previous x-rays in 2020 showed scoliosis.  - Ordered updated x-rays of the spine. - Referred to ortho for further evaluation and management. - Recommended over-the-counter pain management with Tylenol  and ibuprofen .

## 2024-07-31 ENCOUNTER — Telehealth: Payer: Self-pay

## 2024-07-31 NOTE — Telephone Encounter (Signed)
 Called centralized scheduling to schedule a MRI appointment for patient. Was able to schedule appointment for 08/06/24 at 1130am. No prep is needed for appointment and it will be at [facility].  Attempted to call patient's mother twice to inform about upcoming appointment.  Both times it was no answer and no vm set up - stated that call can not be completed.  Sent patient a MyChart message informing about appointment.  Harlene Reiter, CMA

## 2024-08-06 ENCOUNTER — Ambulatory Visit (HOSPITAL_COMMUNITY)
